# Patient Record
Sex: Female | Born: 2000 | Hispanic: No | Marital: Single | Smoking: Never smoker
Health system: Southern US, Community
[De-identification: ages and names within clinical notes are randomized; demographics above are authoritative.]

## PROBLEM LIST (undated history)

## (undated) HISTORY — PX: NO PAST SURGERIES: SHX2092

---

## 2021-04-10 ENCOUNTER — Emergency Department (HOSPITAL_COMMUNITY): Payer: PRIVATE HEALTH INSURANCE

## 2021-04-10 ENCOUNTER — Encounter (HOSPITAL_COMMUNITY): Payer: Self-pay | Admitting: General Surgery

## 2021-04-10 ENCOUNTER — Inpatient Hospital Stay (HOSPITAL_COMMUNITY)
Admission: EM | Admit: 2021-04-10 | Discharge: 2021-04-20 | DRG: 957 | Disposition: A | Discharge status: Rehabilitation Facility | Payer: PRIVATE HEALTH INSURANCE | Attending: General Surgery | Admitting: General Surgery

## 2021-04-10 DIAGNOSIS — S0181XA Laceration without foreign body of other part of head, initial encounter: Secondary | ICD-10-CM | POA: Diagnosis present

## 2021-04-10 DIAGNOSIS — S066XAA Traumatic subarachnoid hemorrhage with loss of consciousness status unknown, initial encounter: Secondary | ICD-10-CM | POA: Diagnosis present

## 2021-04-10 DIAGNOSIS — T148XXA Other injury of unspecified body region, initial encounter: Secondary | ICD-10-CM

## 2021-04-10 DIAGNOSIS — F02818 Dementia in other diseases classified elsewhere, unspecified severity, with other behavioral disturbance: Secondary | ICD-10-CM | POA: Diagnosis not present

## 2021-04-10 DIAGNOSIS — I6200 Nontraumatic subdural hemorrhage, unspecified: Secondary | ICD-10-CM

## 2021-04-10 DIAGNOSIS — S8001XA Contusion of right knee, initial encounter: Secondary | ICD-10-CM | POA: Diagnosis present

## 2021-04-10 DIAGNOSIS — S32049A Unspecified fracture of fourth lumbar vertebra, initial encounter for closed fracture: Secondary | ICD-10-CM | POA: Diagnosis present

## 2021-04-10 DIAGNOSIS — D62 Acute posthemorrhagic anemia: Secondary | ICD-10-CM | POA: Diagnosis present

## 2021-04-10 DIAGNOSIS — S062X4A Diffuse traumatic brain injury with loss of consciousness of 6 hours to 24 hours, initial encounter: Secondary | ICD-10-CM | POA: Diagnosis present

## 2021-04-10 DIAGNOSIS — Z419 Encounter for procedure for purposes other than remedying health state, unspecified: Secondary | ICD-10-CM

## 2021-04-10 DIAGNOSIS — R402312 Coma scale, best motor response, none, at arrival to emergency department: Secondary | ICD-10-CM | POA: Diagnosis present

## 2021-04-10 DIAGNOSIS — Y92410 Unspecified street and highway as the place of occurrence of the external cause: Secondary | ICD-10-CM

## 2021-04-10 DIAGNOSIS — S270XXA Traumatic pneumothorax, initial encounter: Secondary | ICD-10-CM | POA: Diagnosis present

## 2021-04-10 DIAGNOSIS — S062X1S Diffuse traumatic brain injury with loss of consciousness of 30 minutes or less, sequela: Secondary | ICD-10-CM | POA: Diagnosis not present

## 2021-04-10 DIAGNOSIS — R402122 Coma scale, eyes open, to pain, at arrival to emergency department: Secondary | ICD-10-CM | POA: Diagnosis present

## 2021-04-10 DIAGNOSIS — S32009A Unspecified fracture of unspecified lumbar vertebra, initial encounter for closed fracture: Secondary | ICD-10-CM | POA: Diagnosis present

## 2021-04-10 DIAGNOSIS — Z23 Encounter for immunization: Secondary | ICD-10-CM

## 2021-04-10 DIAGNOSIS — S065XAA Traumatic subdural hemorrhage with loss of consciousness status unknown, initial encounter: Secondary | ICD-10-CM | POA: Diagnosis present

## 2021-04-10 DIAGNOSIS — M4014 Other secondary kyphosis, thoracic region: Secondary | ICD-10-CM | POA: Diagnosis present

## 2021-04-10 DIAGNOSIS — E440 Moderate protein-calorie malnutrition: Secondary | ICD-10-CM | POA: Diagnosis not present

## 2021-04-10 DIAGNOSIS — S42031S Displaced fracture of lateral end of right clavicle, sequela: Secondary | ICD-10-CM | POA: Diagnosis not present

## 2021-04-10 DIAGNOSIS — S069X0S Unspecified intracranial injury without loss of consciousness, sequela: Secondary | ICD-10-CM | POA: Diagnosis not present

## 2021-04-10 DIAGNOSIS — S22000A Wedge compression fracture of unspecified thoracic vertebra, initial encounter for closed fracture: Secondary | ICD-10-CM | POA: Diagnosis present

## 2021-04-10 DIAGNOSIS — R0603 Acute respiratory distress: Secondary | ICD-10-CM

## 2021-04-10 DIAGNOSIS — S42031A Displaced fracture of lateral end of right clavicle, initial encounter for closed fracture: Secondary | ICD-10-CM | POA: Diagnosis present

## 2021-04-10 DIAGNOSIS — I609 Nontraumatic subarachnoid hemorrhage, unspecified: Secondary | ICD-10-CM

## 2021-04-10 DIAGNOSIS — R402212 Coma scale, best verbal response, none, at arrival to emergency department: Secondary | ICD-10-CM | POA: Diagnosis present

## 2021-04-10 DIAGNOSIS — S82202A Unspecified fracture of shaft of left tibia, initial encounter for closed fracture: Secondary | ICD-10-CM | POA: Diagnosis present

## 2021-04-10 DIAGNOSIS — S062XAD Diffuse traumatic brain injury with loss of consciousness status unknown, subsequent encounter: Secondary | ICD-10-CM | POA: Diagnosis not present

## 2021-04-10 DIAGNOSIS — S22089A Unspecified fracture of T11-T12 vertebra, initial encounter for closed fracture: Secondary | ICD-10-CM | POA: Diagnosis present

## 2021-04-10 DIAGNOSIS — G936 Cerebral edema: Secondary | ICD-10-CM | POA: Diagnosis present

## 2021-04-10 DIAGNOSIS — Z978 Presence of other specified devices: Secondary | ICD-10-CM

## 2021-04-10 DIAGNOSIS — Z20822 Contact with and (suspected) exposure to covid-19: Secondary | ICD-10-CM | POA: Diagnosis present

## 2021-04-10 DIAGNOSIS — S0990XA Unspecified injury of head, initial encounter: Secondary | ICD-10-CM | POA: Diagnosis present

## 2021-04-10 DIAGNOSIS — S70311A Abrasion, right thigh, initial encounter: Secondary | ICD-10-CM | POA: Diagnosis present

## 2021-04-10 DIAGNOSIS — S069XAA Unspecified intracranial injury with loss of consciousness status unknown, initial encounter: Secondary | ICD-10-CM

## 2021-04-10 DIAGNOSIS — S40012A Contusion of left shoulder, initial encounter: Secondary | ICD-10-CM | POA: Diagnosis present

## 2021-04-10 DIAGNOSIS — S27321A Contusion of lung, unilateral, initial encounter: Secondary | ICD-10-CM | POA: Diagnosis present

## 2021-04-10 DIAGNOSIS — S065XAD Traumatic subdural hemorrhage with loss of consciousness status unknown, subsequent encounter: Secondary | ICD-10-CM | POA: Diagnosis not present

## 2021-04-10 DIAGNOSIS — M25572 Pain in left ankle and joints of left foot: Secondary | ICD-10-CM

## 2021-04-10 DIAGNOSIS — J9601 Acute respiratory failure with hypoxia: Secondary | ICD-10-CM | POA: Diagnosis present

## 2021-04-10 DIAGNOSIS — R339 Retention of urine, unspecified: Secondary | ICD-10-CM | POA: Diagnosis not present

## 2021-04-10 DIAGNOSIS — G479 Sleep disorder, unspecified: Secondary | ICD-10-CM | POA: Diagnosis not present

## 2021-04-10 DIAGNOSIS — S82202S Unspecified fracture of shaft of left tibia, sequela: Secondary | ICD-10-CM | POA: Diagnosis not present

## 2021-04-10 DIAGNOSIS — S069X2S Unspecified intracranial injury with loss of consciousness of 31 minutes to 59 minutes, sequela: Secondary | ICD-10-CM | POA: Diagnosis not present

## 2021-04-10 DIAGNOSIS — S22080S Wedge compression fracture of T11-T12 vertebra, sequela: Secondary | ICD-10-CM | POA: Diagnosis not present

## 2021-04-10 DIAGNOSIS — J939 Pneumothorax, unspecified: Secondary | ICD-10-CM

## 2021-04-10 DIAGNOSIS — S069XAS Unspecified intracranial injury with loss of consciousness status unknown, sequela: Secondary | ICD-10-CM | POA: Diagnosis not present

## 2021-04-10 DIAGNOSIS — S069X3S Unspecified intracranial injury with loss of consciousness of 1 hour to 5 hours 59 minutes, sequela: Secondary | ICD-10-CM | POA: Diagnosis not present

## 2021-04-10 DIAGNOSIS — S42001A Fracture of unspecified part of right clavicle, initial encounter for closed fracture: Secondary | ICD-10-CM | POA: Diagnosis present

## 2021-04-10 DIAGNOSIS — R402431 Glasgow coma scale score 3-8, in the field [EMT or ambulance]: Secondary | ICD-10-CM

## 2021-04-10 HISTORY — DX: Unspecified intracranial injury with loss of consciousness status unknown, initial encounter: S06.9XAA

## 2021-04-10 LAB — CBC
HCT: 44 % (ref 36.0–46.0)
Hemoglobin: 13.7 g/dL (ref 12.0–15.0)
MCH: 27.3 pg (ref 26.0–34.0)
MCHC: 31.1 g/dL (ref 30.0–36.0)
MCV: 87.6 fL (ref 80.0–100.0)
Platelets: 324 10*3/uL (ref 150–400)
RBC: 5.02 MIL/uL (ref 3.87–5.11)
RDW: 13.3 % (ref 11.5–15.5)
WBC: 25.2 10*3/uL — ABNORMAL HIGH (ref 4.0–10.5)
nRBC: 0 % (ref 0.0–0.2)

## 2021-04-10 LAB — COMPREHENSIVE METABOLIC PANEL
ALT: 41 U/L (ref 0–44)
AST: 78 U/L — ABNORMAL HIGH (ref 15–41)
Albumin: 3.7 g/dL (ref 3.5–5.0)
Alkaline Phosphatase: 64 U/L (ref 38–126)
Anion gap: 10 (ref 5–15)
BUN: 12 mg/dL (ref 6–20)
CO2: 23 mmol/L (ref 22–32)
Calcium: 8.7 mg/dL — ABNORMAL LOW (ref 8.9–10.3)
Chloride: 104 mmol/L (ref 98–111)
Creatinine, Ser: 0.75 mg/dL (ref 0.44–1.00)
GFR, Estimated: >60 mL/min (ref >60)
Glucose, Bld: 171 mg/dL — ABNORMAL HIGH (ref 70–99)
Potassium: 4.1 mmol/L (ref 3.5–5.1)
Sodium: 137 mmol/L (ref 135–145)
Total Bilirubin: 0.8 mg/dL (ref 0.3–1.2)
Total Protein: 6.6 g/dL (ref 6.5–8.1)

## 2021-04-10 LAB — SAMPLE TO BLOOD BANK

## 2021-04-10 LAB — I-STAT ARTERIAL BLOOD GAS, ED
Acid-Base Excess: 2 mmol/L (ref 0.0–2.0)
Bicarbonate: 25.3 mmol/L (ref 20.0–28.0)
Calcium, Ion: 1.17 mmol/L (ref 1.15–1.40)
HCT: 36 % (ref 36.0–46.0)
Hemoglobin: 12.2 g/dL (ref 12.0–15.0)
O2 Saturation: 100 %
Patient temperature: 97.7
Potassium: 3.9 mmol/L (ref 3.5–5.1)
Sodium: 137 mmol/L (ref 135–145)
TCO2: 26 mmol/L (ref 22–32)
pCO2 arterial: 33.4 mmHg (ref 32.0–48.0)
pH, Arterial: 7.485 — ABNORMAL HIGH (ref 7.350–7.450)
pO2, Arterial: 567 mmHg — ABNORMAL HIGH (ref 83.0–108.0)

## 2021-04-10 LAB — I-STAT BETA HCG BLOOD, ED (MC, WL, AP ONLY): I-stat hCG, quantitative: <5 m[IU]/mL (ref <5)

## 2021-04-10 LAB — I-STAT CHEM 8, ED
BUN: 13 mg/dL (ref 6–20)
Calcium, Ion: 1.1 mmol/L — ABNORMAL LOW (ref 1.15–1.40)
Chloride: 105 mmol/L (ref 98–111)
Creatinine, Ser: 0.7 mg/dL (ref 0.44–1.00)
Glucose, Bld: 165 mg/dL — ABNORMAL HIGH (ref 70–99)
HCT: 42 % (ref 36.0–46.0)
Hemoglobin: 14.3 g/dL (ref 12.0–15.0)
Potassium: 3.9 mmol/L (ref 3.5–5.1)
Sodium: 138 mmol/L (ref 135–145)
TCO2: 27 mmol/L (ref 22–32)

## 2021-04-10 LAB — RESP PANEL BY RT-PCR (FLU A&B, COVID) ARPGX2
Influenza A by PCR: NEGATIVE
Influenza B by PCR: NEGATIVE
SARS Coronavirus 2 by RT PCR: NEGATIVE

## 2021-04-10 LAB — ETHANOL: Alcohol, Ethyl (B): <10 mg/dL (ref <10)

## 2021-04-10 LAB — PROTIME-INR
INR: 1.2 (ref 0.8–1.2)
Prothrombin Time: 15.5 seconds — ABNORMAL HIGH (ref 11.4–15.2)

## 2021-04-10 LAB — LACTIC ACID, PLASMA: Lactic Acid, Venous: 2 mmol/L (ref 0.5–1.9)

## 2021-04-10 LAB — MRSA NEXT GEN BY PCR, NASAL: MRSA by PCR Next Gen: NOT DETECTED

## 2021-04-10 MED ORDER — FENTANYL CITRATE PF 50 MCG/ML IJ SOSY
50.0000 ug | PREFILLED_SYRINGE | Freq: Once | INTRAMUSCULAR | Status: AC
  Administered 2021-04-10: 50 ug via INTRAVENOUS

## 2021-04-10 MED ORDER — METOPROLOL TARTRATE 5 MG/5ML IV SOLN: 5.0000 mg | Freq: Four times a day (QID) | INTRAVENOUS | Status: DC | PRN

## 2021-04-10 MED ORDER — ORAL CARE MOUTH RINSE
15.0000 mL | OROMUCOSAL | Status: DC
  Administered 2021-04-10 – 2021-04-12 (×11): 15 mL via OROMUCOSAL

## 2021-04-10 MED ORDER — TETANUS-DIPHTH-ACELL PERTUSSIS 5-2.5-18.5 LF-MCG/0.5 IM SUSY
0.5000 mL | PREFILLED_SYRINGE | Freq: Once | INTRAMUSCULAR | Status: AC
  Administered 2021-04-10: 0.5 mL via INTRAMUSCULAR
  Filled 2021-04-10: qty 0.5

## 2021-04-10 MED ORDER — PANTOPRAZOLE SODIUM 40 MG IV SOLR
40.0000 mg | Freq: Every day | INTRAVENOUS | Status: DC
  Administered 2021-04-10 – 2021-04-11 (×2): 40 mg via INTRAVENOUS
  Filled 2021-04-10 (×2): qty 40

## 2021-04-10 MED ORDER — CHLORHEXIDINE GLUCONATE 0.12% ORAL RINSE (MEDLINE KIT)
15.0000 mL | Freq: Two times a day (BID) | OROMUCOSAL | Status: DC
  Administered 2021-04-10 – 2021-04-11 (×2): 15 mL via OROMUCOSAL

## 2021-04-10 MED ORDER — POLYETHYLENE GLYCOL 3350 17 G PO PACK
17.0000 g | PACK | Freq: Every day | ORAL | Status: DC
  Administered 2021-04-10 – 2021-04-12 (×2): 17 g
  Filled 2021-04-10 (×2): qty 1

## 2021-04-10 MED ORDER — PANTOPRAZOLE SODIUM 40 MG PO TBEC
40.0000 mg | DELAYED_RELEASE_TABLET | Freq: Every day | ORAL | Status: DC
  Filled 2021-04-10: qty 1

## 2021-04-10 MED ORDER — ONDANSETRON 4 MG PO TBDP: 4.0000 mg | ORAL_TABLET | Freq: Four times a day (QID) | ORAL | Status: DC | PRN

## 2021-04-10 MED ORDER — IOHEXOL 350 MG/ML SOLN
100.0000 mL | Freq: Once | INTRAVENOUS | Status: AC | PRN
  Administered 2021-04-10: 100 mL via INTRAVENOUS

## 2021-04-10 MED ORDER — FENTANYL CITRATE PF 50 MCG/ML IJ SOSY: 50.0000 ug | PREFILLED_SYRINGE | Freq: Once | INTRAMUSCULAR | Status: DC

## 2021-04-10 MED ORDER — ONDANSETRON HCL 4 MG/2ML IJ SOLN
4.0000 mg | Freq: Four times a day (QID) | INTRAMUSCULAR | Status: DC | PRN
  Administered 2021-04-12: 4 mg via INTRAVENOUS
  Filled 2021-04-10: qty 2

## 2021-04-10 MED ORDER — PROPOFOL 1000 MG/100ML IV EMUL
0.0000 ug/kg/min | INTRAVENOUS | Status: DC
  Administered 2021-04-10: 30 ug/kg/min via INTRAVENOUS
  Administered 2021-04-10: 15 ug/kg/min via INTRAVENOUS
  Filled 2021-04-10: qty 100

## 2021-04-10 MED ORDER — SUCCINYLCHOLINE CHLORIDE 20 MG/ML IJ SOLN
INTRAMUSCULAR | Status: AC | PRN
  Administered 2021-04-10: 100 mg via INTRAVENOUS

## 2021-04-10 MED ORDER — FENTANYL 2500MCG IN NS 250ML (10MCG/ML) PREMIX INFUSION
50.0000 ug/h | INTRAVENOUS | Status: DC
Start: 2021-04-10 — End: 2021-04-10
  Administered 2021-04-10: 50 ug/h via INTRAVENOUS

## 2021-04-10 MED ORDER — SODIUM CHLORIDE 0.9 % IV SOLN
INTRAVENOUS | Status: AC | PRN
  Administered 2021-04-10: 1000 mL via INTRAVENOUS

## 2021-04-10 MED ORDER — ETOMIDATE 2 MG/ML IV SOLN
INTRAVENOUS | Status: AC | PRN
  Administered 2021-04-10: 20 mg via INTRAVENOUS

## 2021-04-10 MED ORDER — PROPOFOL 1000 MG/100ML IV EMUL
INTRAVENOUS | Status: AC
  Administered 2021-04-10: 15 ug/kg/min via INTRAVENOUS
  Filled 2021-04-10: qty 100

## 2021-04-10 MED ORDER — PROPOFOL 1000 MG/100ML IV EMUL: 0.0000 ug/kg/min | INTRAVENOUS | Status: DC

## 2021-04-10 MED ORDER — POTASSIUM CHLORIDE IN NACL 20-0.9 MEQ/L-% IV SOLN
INTRAVENOUS | Status: DC
  Filled 2021-04-10 (×17): qty 1000

## 2021-04-10 MED ORDER — FENTANYL BOLUS VIA INFUSION
50.0000 ug | INTRAVENOUS | Status: DC | PRN
  Filled 2021-04-10: qty 100

## 2021-04-10 MED ORDER — DOCUSATE SODIUM 50 MG/5ML PO LIQD
100.0000 mg | Freq: Two times a day (BID) | ORAL | Status: DC
  Administered 2021-04-10 (×2): 100 mg
  Filled 2021-04-10 (×3): qty 10

## 2021-04-10 MED ORDER — FENTANYL 2500MCG IN NS 250ML (10MCG/ML) PREMIX INFUSION
50.0000 ug/h | INTRAVENOUS | Status: DC
  Administered 2021-04-10: 75 ug/h via INTRAVENOUS
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
## 2021-04-10 NOTE — ED Notes (Signed)
Patient transported to CT 1

## 2021-04-10 NOTE — Progress Notes (Signed)
Patient transported to 4N27 from ED without complications. RN at bedside. 
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
## 2021-04-10 NOTE — Progress Notes (Signed)
Pt transported to CT and back to TRA A without any complications.  
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
## 2021-04-10 NOTE — Consult Note (Signed)
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 tenderness and deformity present.  Skin:    General: Skin is warm.     Capillary Refill: Capillary refill takes less than 2 seconds.     Coloration: Skin is not jaundiced.     Findings: Bruising present.  Neurological:     Comments: Sedated    Assessment/Plan: Complex laceration to forehead.  Repaired in ED.  Recommend Vaseline to area twice daily.  Head of bed elevated as able.  Wash hair when stable.  Follow up in 10 days as able.  Sherry Montes Sherry Montes 04/11/2021, 11:27 AM
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
## 2021-04-10 NOTE — Procedures (Signed)
Procedure Note  Level I trauma  Preoperative Dx: Complex laceration to forehead  Postoperative Dx: Same  Procedure: Layered repair of complex laceration to forehead 10 cm with excision of nonviable tissue 1 cm  Anesthesia: Lidocaine 1% with 1:100,000 epinephrine  Indication for Procedure: Level I trauma  Description of Procedure: Risks and complications were explained to the patient however she was sedated and intubated.  Time out was called and all information was confirmed to be correct.    The 10 cm area of forehead was prepped and drapped.  Lidocaine 1% with epinepherine was injected in the subcutaneous area.  There was no glass noted in the wound. After waiting several minutes for the local to take affect a #15 blade was used to excise the 1 cm area of nonviable tissue at the midpoint of the laceration.  A combination of 5-0 Monocryl and 4-0 Monocryl was used to close the 10 cm wound in a layered fashion.  Care was taken to line up the hair line.  A combination of interrupted and running suture was utilized.   The patient was intubated and sedated.  Recommend vaseline to the area twice daily and head of bed elevated as able when cleared with Neuro.

## 2021-04-11 ENCOUNTER — Inpatient Hospital Stay (HOSPITAL_COMMUNITY): Payer: PRIVATE HEALTH INSURANCE

## 2021-04-11 LAB — POCT I-STAT 7, (LYTES, BLD GAS, ICA,H+H)
Acid-base deficit: 4 mmol/L — ABNORMAL HIGH (ref 0.0–2.0)
Bicarbonate: 20 mmol/L (ref 20.0–28.0)
Calcium, Ion: 1.23 mmol/L (ref 1.15–1.40)
HCT: 32 % — ABNORMAL LOW (ref 36.0–46.0)
Hemoglobin: 10.9 g/dL — ABNORMAL LOW (ref 12.0–15.0)
O2 Saturation: 100 %
Patient temperature: 98
Potassium: 3.8 mmol/L (ref 3.5–5.1)
Sodium: 138 mmol/L (ref 135–145)
TCO2: 21 mmol/L — ABNORMAL LOW (ref 22–32)
pCO2 arterial: 31 mmHg — ABNORMAL LOW (ref 32.0–48.0)
pH, Arterial: 7.416 (ref 7.350–7.450)
pO2, Arterial: 162 mmHg — ABNORMAL HIGH (ref 83.0–108.0)

## 2021-04-11 LAB — BASIC METABOLIC PANEL
Anion gap: 8 (ref 5–15)
BUN: 12 mg/dL (ref 6–20)
CO2: 21 mmol/L — ABNORMAL LOW (ref 22–32)
Calcium: 8.3 mg/dL — ABNORMAL LOW (ref 8.9–10.3)
Chloride: 107 mmol/L (ref 98–111)
Creatinine, Ser: 0.68 mg/dL (ref 0.44–1.00)
GFR, Estimated: >60 mL/min (ref >60)
Glucose, Bld: 127 mg/dL — ABNORMAL HIGH (ref 70–99)
Potassium: 4 mmol/L (ref 3.5–5.1)
Sodium: 136 mmol/L (ref 135–145)

## 2021-04-11 LAB — CBC
HCT: 32.3 % — ABNORMAL LOW (ref 36.0–46.0)
Hemoglobin: 10.8 g/dL — ABNORMAL LOW (ref 12.0–15.0)
MCH: 27.6 pg (ref 26.0–34.0)
MCHC: 33.4 g/dL (ref 30.0–36.0)
MCV: 82.6 fL (ref 80.0–100.0)
Platelets: 192 10*3/uL (ref 150–400)
RBC: 3.91 MIL/uL (ref 3.87–5.11)
RDW: 13.6 % (ref 11.5–15.5)
WBC: 10.6 10*3/uL — ABNORMAL HIGH (ref 4.0–10.5)
nRBC: 0 % (ref 0.0–0.2)

## 2021-04-11 LAB — TRIGLYCERIDES: Triglycerides: 49 mg/dL (ref <150)

## 2021-04-11 LAB — HIV ANTIBODY (ROUTINE TESTING W REFLEX): HIV Screen 4th Generation wRfx: NONREACTIVE

## 2021-04-11 MED ORDER — METHOCARBAMOL 500 MG PO TABS
1000.0000 mg | ORAL_TABLET | Freq: Three times a day (TID) | ORAL | Status: DC
  Administered 2021-04-11 – 2021-04-20 (×13): 1000 mg via ORAL
  Filled 2021-04-11 (×17): qty 2

## 2021-04-11 MED ORDER — MORPHINE SULFATE (PF) 2 MG/ML IV SOLN
2.0000 mg | INTRAVENOUS | Status: DC | PRN
Start: 2021-04-11 — End: 2021-04-20
  Administered 2021-04-11 – 2021-04-16 (×2): 2 mg via INTRAVENOUS
  Administered 2021-04-17: 4 mg via INTRAVENOUS
  Administered 2021-04-17: 2 mg via INTRAVENOUS
  Filled 2021-04-11 (×3): qty 1
  Filled 2021-04-11: qty 2

## 2021-04-11 MED ORDER — WHITE PETROLATUM EX OINT
TOPICAL_OINTMENT | CUTANEOUS | Status: AC
  Filled 2021-04-11: qty 28.35

## 2021-04-11 MED ORDER — ENOXAPARIN SODIUM 30 MG/0.3ML IJ SOSY
30.0000 mg | PREFILLED_SYRINGE | Freq: Two times a day (BID) | INTRAMUSCULAR | Status: DC
  Administered 2021-04-12 – 2021-04-20 (×10): 30 mg via SUBCUTANEOUS
  Filled 2021-04-11 (×13): qty 0.3

## 2021-04-11 MED ORDER — ACETAMINOPHEN 500 MG PO TABS
1000.0000 mg | ORAL_TABLET | Freq: Four times a day (QID) | ORAL | Status: DC
  Administered 2021-04-11 – 2021-04-19 (×15): 1000 mg via ORAL
  Filled 2021-04-11 (×24): qty 2

## 2021-04-11 MED ORDER — OXYCODONE HCL 5 MG/5ML PO SOLN: 5.0000 mg | ORAL | Status: DC | PRN

## 2021-04-11 MED ORDER — CHLORHEXIDINE GLUCONATE CLOTH 2 % EX PADS
6.0000 | MEDICATED_PAD | Freq: Every day | CUTANEOUS | Status: DC
  Administered 2021-04-11 – 2021-04-17 (×7): 6 via TOPICAL

## 2021-04-11 MED ORDER — KETOROLAC TROMETHAMINE 15 MG/ML IJ SOLN
30.0000 mg | Freq: Four times a day (QID) | INTRAMUSCULAR | Status: DC
  Administered 2021-04-11 – 2021-04-12 (×3): 30 mg via INTRAVENOUS
  Filled 2021-04-11 (×4): qty 2

## 2021-04-11 NOTE — Progress Notes (Signed)
PT Cancellation Note  Patient Details Name: Sherry Montes MRN: [id number] DOB: [birth year]   Cancelled Treatment:    Reason Eval/Treat Not Completed: Medical issues which prohibited therapy - awaiting imaging per neurosurgery, PT to check back when medically appropriate.  [name], PT DPT Acute Rehabilitation Services Pager [phone]  Office [phone]    [name] 04/11/2021, 3:03 PM
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
## 2021-04-11 NOTE — Progress Notes (Signed)
Family at bedside stated that patient can't feel anything in her bilateral feet. RN assessed and pt did not withdrawal to pain.

## 2021-04-11 NOTE — Progress Notes (Addendum)
7225 RN called to room, pt self extubated. Dr. Bedelia Person and RRT at bedside. Dr. Bedelia Person removed c-spine collar. Pt on non-rebreather, O2 100%. WCTM  0800 Pt attempting to pull out IV, take bandage off of forehead, and remove NRB. Mittens unsuccessful, soft limb restraints applied.   0815 Pt c/o abdominal pain, pt states she can't urinate. Pt bladder scanned noted. Spoke to Dr. Bedelia Person, order to insert foley catheter, 850 of clear, amber urine emptied.

## 2021-04-11 NOTE — Procedures (Signed)
Extubation Procedure Note  Patient Details:   Name: Sherry Montes DOB: 10-Jan-2001 MRN: [id number]   Airway Documentation:    Vent end date: 04/11/21 Vent end time: 0752   Evaluation  RT responded to vent alarm and found pt had self extubated. MD and RN were called. Pt was placed on 100% NRB mask. No new orders at this time. Guss Bunde 04/11/2021, 7:54 AM
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
## 2021-04-12 LAB — CBC
HCT: 28 % — ABNORMAL LOW (ref 36.0–46.0)
Hemoglobin: 8.9 g/dL — ABNORMAL LOW (ref 12.0–15.0)
MCH: 27.2 pg (ref 26.0–34.0)
MCHC: 31.8 g/dL (ref 30.0–36.0)
MCV: 85.6 fL (ref 80.0–100.0)
Platelets: 115 10*3/uL — ABNORMAL LOW (ref 150–400)
RBC: 3.27 MIL/uL — ABNORMAL LOW (ref 3.87–5.11)
RDW: 13.7 % (ref 11.5–15.5)
WBC: 7 10*3/uL (ref 4.0–10.5)
nRBC: 0 % (ref 0.0–0.2)

## 2021-04-12 LAB — BASIC METABOLIC PANEL
Anion gap: 7 (ref 5–15)
BUN: 6 mg/dL (ref 6–20)
CO2: 18 mmol/L — ABNORMAL LOW (ref 22–32)
Calcium: 8 mg/dL — ABNORMAL LOW (ref 8.9–10.3)
Chloride: 113 mmol/L — ABNORMAL HIGH (ref 98–111)
Creatinine, Ser: 0.62 mg/dL (ref 0.44–1.00)
GFR, Estimated: >60 mL/min (ref >60)
Glucose, Bld: 108 mg/dL — ABNORMAL HIGH (ref 70–99)
Potassium: 4 mmol/L (ref 3.5–5.1)
Sodium: 138 mmol/L (ref 135–145)

## 2021-04-12 LAB — TRIGLYCERIDES: Triglycerides: 65 mg/dL (ref <150)

## 2021-04-12 MED ORDER — POLYETHYLENE GLYCOL 3350 17 G PO PACK
17.0000 g | PACK | Freq: Every day | ORAL | Status: DC
  Administered 2021-04-13 – 2021-04-20 (×5): 17 g via ORAL
  Filled 2021-04-12 (×6): qty 1

## 2021-04-12 MED ORDER — DOCUSATE SODIUM 100 MG PO CAPS
100.0000 mg | ORAL_CAPSULE | Freq: Two times a day (BID) | ORAL | Status: DC
  Administered 2021-04-13 – 2021-04-20 (×7): 100 mg via ORAL
  Filled 2021-04-12 (×10): qty 1

## 2021-04-12 MED ORDER — OXYCODONE HCL 5 MG PO TABS
5.0000 mg | ORAL_TABLET | ORAL | Status: DC | PRN
Start: 2021-04-12 — End: 2021-04-20
  Administered 2021-04-13: 5 mg via ORAL
  Administered 2021-04-18 – 2021-04-19 (×3): 10 mg via ORAL
  Filled 2021-04-12 (×5): qty 2

## 2021-04-12 MED ORDER — CHLORHEXIDINE GLUCONATE 0.12 % MT SOLN
15.0000 mL | Freq: Two times a day (BID) | OROMUCOSAL | Status: DC
  Administered 2021-04-12 – 2021-04-17 (×8): 15 mL via OROMUCOSAL
  Filled 2021-04-12 (×11): qty 15

## 2021-04-12 MED ORDER — ORAL CARE MOUTH RINSE
15.0000 mL | Freq: Two times a day (BID) | OROMUCOSAL | Status: DC
  Administered 2021-04-13 – 2021-04-15 (×2): 15 mL via OROMUCOSAL
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
## 2021-04-12 NOTE — TOC CAGE-AID Note (Signed)
Transition of Care [facility]) - CAGE-AID Screening   Patient Details  Name: Marshawn Ninneman MRN: [id number] Date of Birth: 07/18/2000  Transition of [facility]) CM/SW Contact:    Luane Rochon C Tarpley-Carter, LCSWA Phone Number: 04/12/2021, 12:17 PM   Clinical Narrative: Pt is unable to participate in Cage Aid.  Nannie Starzyk Tarpley-Carter, MSW, LCSW-A Pronouns:  She/Her/Hers [facility] Clinical Social Worker Direct Number:  [phone] [email or web address]  CAGE-AID Screening: Substance Abuse Screening unable to be completed due to: : Patient unable to participate             Substance Abuse Education Offered: No
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
## 2021-04-12 NOTE — Progress Notes (Signed)
Dr. Austin Miles is a family friend and his number is in the chart [phone]). Family and him are asking for Dr. Maurice Small or neurosurgery on call to please call and update with recent MRI results in the morning (12/9).
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
## 2021-04-12 NOTE — Progress Notes (Signed)
? ?  Inpatient Rehab Admissions Coordinator : ? ?Per therapy recommendations, patient was screened for CIR candidacy by Marcelle Bebout RN MSN.  At this time patient appears to be a potential candidate for CIR. I will place a rehab consult per protocol for full assessment. Please call me with any questions. ? ?Arraya Buck RN MSN ?Admissions Coordinator ?[phone] ?  ?
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
## 2021-04-12 NOTE — Progress Notes (Signed)
Patient had one episode of vomiting. Given PRN zofran and washed out mouth with medline. Patient states she feels much better. Dr Janee Morn notified.  Sherral Hammers, RN
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
## 2021-04-13 LAB — BASIC METABOLIC PANEL
Anion gap: 8 (ref 5–15)
BUN: 11 mg/dL (ref 6–20)
CO2: 19 mmol/L — ABNORMAL LOW (ref 22–32)
Calcium: 8 mg/dL — ABNORMAL LOW (ref 8.9–10.3)
Chloride: 111 mmol/L (ref 98–111)
Creatinine, Ser: 0.54 mg/dL (ref 0.44–1.00)
GFR, Estimated: >60 mL/min (ref >60)
Glucose, Bld: 92 mg/dL (ref 70–99)
Potassium: 4.4 mmol/L (ref 3.5–5.1)
Sodium: 138 mmol/L (ref 135–145)

## 2021-04-13 LAB — CBC
HCT: 30.3 % — ABNORMAL LOW (ref 36.0–46.0)
Hemoglobin: 9.9 g/dL — ABNORMAL LOW (ref 12.0–15.0)
MCH: 27.7 pg (ref 26.0–34.0)
MCHC: 32.7 g/dL (ref 30.0–36.0)
MCV: 84.9 fL (ref 80.0–100.0)
Platelets: 120 10*3/uL — ABNORMAL LOW (ref 150–400)
RBC: 3.57 MIL/uL — ABNORMAL LOW (ref 3.87–5.11)
RDW: 13.4 % (ref 11.5–15.5)
WBC: 6.9 10*3/uL (ref 4.0–10.5)
nRBC: 0 % (ref 0.0–0.2)
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
## 2021-04-13 NOTE — Progress Notes (Signed)
Inpatient Rehab Admissions Coordinator:   I spoke with pt.'s aunt and uncle (Pt. Sleeping) regarding potential CIR admit. They state interest and would like to pursue CIR.  They confirm ability to provide 24/7 support. They are looking for information on Pt.'s insurance, as they think she may have had a policy on file. They will reach out to me if they find any information on a policy. I will continue to follow for potential admit pending insurance auth (if she has insurance), medical readiness,  and bed availability.   Megan Salon, MS, CCC-SLP Rehab Admissions Coordinator  [phone] (celll) [phone] (office)
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
## 2021-04-13 NOTE — Progress Notes (Signed)
Orthopedic Tech Progress Note Patient Details:  Sherry Montes 2001-03-25 [id number]  Ortho Devices Type of Ortho Device: Arm sling Ortho Device/Splint Location: RUE Ortho Device/Splint Interventions: Ordered, Application   Post Interventions Patient Tolerated: Well  Iveth Heidemann A Ingris Pasquarella 04/13/2021, 1:28 PM
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
## 2021-04-14 LAB — BASIC METABOLIC PANEL
Anion gap: 6 (ref 5–15)
BUN: 7 mg/dL (ref 6–20)
CO2: 20 mmol/L — ABNORMAL LOW (ref 22–32)
Calcium: 8 mg/dL — ABNORMAL LOW (ref 8.9–10.3)
Chloride: 109 mmol/L (ref 98–111)
Creatinine, Ser: 0.54 mg/dL (ref 0.44–1.00)
GFR, Estimated: >60 mL/min (ref >60)
Glucose, Bld: 98 mg/dL (ref 70–99)
Potassium: 4.2 mmol/L (ref 3.5–5.1)
Sodium: 135 mmol/L (ref 135–145)

## 2021-04-14 LAB — CBC
HCT: 28.1 % — ABNORMAL LOW (ref 36.0–46.0)
Hemoglobin: 9.1 g/dL — ABNORMAL LOW (ref 12.0–15.0)
MCH: 27.3 pg (ref 26.0–34.0)
MCHC: 32.4 g/dL (ref 30.0–36.0)
MCV: 84.4 fL (ref 80.0–100.0)
Platelets: 158 10*3/uL (ref 150–400)
RBC: 3.33 MIL/uL — ABNORMAL LOW (ref 3.87–5.11)
RDW: 13.5 % (ref 11.5–15.5)
WBC: 5.3 10*3/uL (ref 4.0–10.5)
nRBC: 0 % (ref 0.0–0.2)

## 2021-04-14 MED ORDER — BETHANECHOL CHLORIDE 25 MG PO TABS
25.0000 mg | ORAL_TABLET | Freq: Three times a day (TID) | ORAL | Status: DC
  Administered 2021-04-14 – 2021-04-20 (×4): 25 mg via ORAL
  Filled 2021-04-14 (×10): qty 1

## 2021-04-14 NOTE — Progress Notes (Signed)
Patient c/o difficulty urinating with little output during shift, bladder scan showed 800 mL retained, MD paged, order to place Q6 hr in and out catheterization PRN x 3.   At time of placing note, patient self urinated 500 mL and felt much better.
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
## 2021-04-14 NOTE — Plan of Care (Signed)
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
## 2021-04-16 ENCOUNTER — Inpatient Hospital Stay (HOSPITAL_COMMUNITY): Payer: PRIVATE HEALTH INSURANCE

## 2021-04-16 LAB — BASIC METABOLIC PANEL
Anion gap: 9 (ref 5–15)
BUN: 6 mg/dL (ref 6–20)
CO2: 21 mmol/L — ABNORMAL LOW (ref 22–32)
Calcium: 8.6 mg/dL — ABNORMAL LOW (ref 8.9–10.3)
Chloride: 104 mmol/L (ref 98–111)
Creatinine, Ser: 0.6 mg/dL (ref 0.44–1.00)
GFR, Estimated: >60 mL/min (ref >60)
Glucose, Bld: 96 mg/dL (ref 70–99)
Potassium: 4.2 mmol/L (ref 3.5–5.1)
Sodium: 134 mmol/L — ABNORMAL LOW (ref 135–145)
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
## 2021-04-16 NOTE — Progress Notes (Signed)
Pt family requesting for pt to only have soft foods because she has trouble swallowing and her throat is sore, changed diet to mechanical soft, will f/u about another possible SLP consult. Pt able to eat a few bites of dinner tonight but had to spit out larger, harder food.

## 2021-04-16 NOTE — Progress Notes (Signed)
Made multiple attempts to give pt scheduled medications last night and this morning. I was told in report she started refusing during the day yesterday. Have educated the pt on the medications and she is still refusing at this time.
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
## 2021-04-16 NOTE — Progress Notes (Signed)
Pt refusing meds.  Attempted to crush Tylenol and give in chocolate pudding without success. Pt also not eating well.  Will continue attempts to give meds and encourage pt to eat.  Dr. Janee Morn and Johnny Bridge, NP are aware of above.

## 2021-04-16 NOTE — Progress Notes (Signed)
Patient ID: Sherry Montes, female   DOB: July 07, 2000, 20 y.o.   MRN: [id number]   LOS: 6 days   Subjective: Pt c/o left lower leg pain when prompted. Will answer questions but can't really give any details at this point in her TBI recovery.   Objective: Vital signs in last 24 hours: Temp:  [98 F (36.7 C)-99 F (37.2 C)] 98.5 F (36.9 C) (12/12 1532) Pulse Rate:  [73-118] 118 (12/12 1532) Resp:  [16-20] 20 (12/12 0827) BP: (108-117)/(69-78) 114/75 (12/12 1143) SpO2:  [99 %-100 %] 99 % (12/12 0357) Last BM Date:  (PTA)   Laboratory  CBC Recent Labs    04/14/21 0301  WBC 5.3  HGB 9.1*  HCT 28.1*  PLT 158   BMET Recent Labs    04/14/21 0301 04/16/21 0426  NA 135 134*  K 4.2 4.2  CL 109 104  CO2 20* 21*  GLUCOSE 98 96  BUN 7 6  CREATININE 0.54 0.60  CALCIUM 8.0* 8.6*     Physical Exam General appearance: alert and no distress LLE: Mod TTP, extensive ecchymoses lower leg   Assessment/Plan: Left tibia fx -- Plan IMN with Dr. Carola Frost tomorrow. Please keep NPO after MN.    Freeman Caldron, PA-C Orthopedic Surgery [phone] 04/16/2021

## 2021-04-16 NOTE — Progress Notes (Addendum)
Inpatient Rehab Admissions Coordinator:    I do not have a bed for this Pt. On CIR day. Pt. States she has [employer], we were able to identify a policy number. I will send to billing and open a case for insurance auth once Pt. Is seen by PT/OT today.;   Megan Salon, MS, CCC-SLP Rehab Admissions Coordinator  [phone] (celll) [phone] (office)
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
## 2021-04-16 NOTE — Progress Notes (Signed)
Orthopedic Tech Progress Note Patient Details:  Sherry Montes March 08, 2001 [id number]  Ortho Devices Type of Ortho Device: Short leg splint, Cotton web roll Ortho Device/Splint Location: LLE Ortho Device/Splint Interventions: Ordered, Application   Post Interventions Patient Tolerated: Well Instructions Provided: Care of device  [name] 04/16/2021, 5:13 PM

## 2021-04-17 ENCOUNTER — Inpatient Hospital Stay (HOSPITAL_COMMUNITY): Payer: PRIVATE HEALTH INSURANCE | Admitting: Certified Registered"

## 2021-04-17 ENCOUNTER — Inpatient Hospital Stay (HOSPITAL_COMMUNITY): Payer: PRIVATE HEALTH INSURANCE

## 2021-04-17 ENCOUNTER — Encounter (HOSPITAL_COMMUNITY): Payer: Self-pay | Admitting: General Surgery

## 2021-04-17 ENCOUNTER — Encounter (HOSPITAL_COMMUNITY): Admission: EM | Disposition: A | Discharge status: Rehabilitation Facility | Payer: Self-pay | Source: Home / Self Care

## 2021-04-17 HISTORY — PX: TIBIA IM NAIL INSERTION: SHX2516

## 2021-04-17 SURGERY — INSERTION, INTRAMEDULLARY ROD, TIBIA
Anesthesia: General | Site: Leg Lower | Laterality: Left

## 2021-04-17 MED ORDER — ROCURONIUM BROMIDE 10 MG/ML (PF) SYRINGE
PREFILLED_SYRINGE | INTRAVENOUS | Status: DC | PRN
  Administered 2021-04-17: 60 mg via INTRAVENOUS
  Administered 2021-04-17: 50 mg via INTRAVENOUS
  Administered 2021-04-17: 30 mg via INTRAVENOUS

## 2021-04-17 MED ORDER — CHLORHEXIDINE GLUCONATE 4 % EX LIQD: 60.0000 mL | Freq: Once | CUTANEOUS | Status: DC

## 2021-04-17 MED ORDER — ONDANSETRON HCL 4 MG/2ML IJ SOLN
INTRAMUSCULAR | Status: DC | PRN
  Administered 2021-04-17: 4 mg via INTRAVENOUS

## 2021-04-17 MED ORDER — 0.9 % SODIUM CHLORIDE (POUR BTL) OPTIME
TOPICAL | Status: DC | PRN
  Administered 2021-04-17: 1000 mL

## 2021-04-17 MED ORDER — LACTATED RINGERS IV SOLN: INTRAVENOUS | Status: DC

## 2021-04-17 MED ORDER — FENTANYL CITRATE (PF) 100 MCG/2ML IJ SOLN
25.0000 ug | INTRAMUSCULAR | Status: DC | PRN
  Administered 2021-04-17 (×2): 50 ug via INTRAVENOUS

## 2021-04-17 MED ORDER — KETOROLAC TROMETHAMINE 30 MG/ML IJ SOLN
INTRAMUSCULAR | Status: AC
  Filled 2021-04-17: qty 1

## 2021-04-17 MED ORDER — CEFAZOLIN SODIUM-DEXTROSE 2-4 GM/100ML-% IV SOLN
INTRAVENOUS | Status: AC
  Filled 2021-04-17: qty 100

## 2021-04-17 MED ORDER — FENTANYL CITRATE (PF) 250 MCG/5ML IJ SOLN
INTRAMUSCULAR | Status: AC
  Filled 2021-04-17: qty 5

## 2021-04-17 MED ORDER — PROPOFOL 10 MG/ML IV BOLUS
INTRAVENOUS | Status: DC | PRN
  Administered 2021-04-17: 120 mg via INTRAVENOUS

## 2021-04-17 MED ORDER — ACETAMINOPHEN 10 MG/ML IV SOLN
INTRAVENOUS | Status: DC | PRN
  Administered 2021-04-17: 1000 mg via INTRAVENOUS

## 2021-04-17 MED ORDER — ROCURONIUM BROMIDE 10 MG/ML (PF) SYRINGE
PREFILLED_SYRINGE | INTRAVENOUS | Status: AC
  Filled 2021-04-17: qty 10

## 2021-04-17 MED ORDER — PROPOFOL 10 MG/ML IV BOLUS
INTRAVENOUS | Status: AC
  Filled 2021-04-17: qty 20

## 2021-04-17 MED ORDER — ACETAMINOPHEN 10 MG/ML IV SOLN
INTRAVENOUS | Status: AC
  Filled 2021-04-17: qty 100

## 2021-04-17 MED ORDER — POVIDONE-IODINE 10 % EX SWAB: 2.0000 | Freq: Once | CUTANEOUS | Status: DC

## 2021-04-17 MED ORDER — FENTANYL CITRATE (PF) 100 MCG/2ML IJ SOLN
INTRAMUSCULAR | Status: AC
  Filled 2021-04-17: qty 2

## 2021-04-17 MED ORDER — KETOROLAC TROMETHAMINE 30 MG/ML IJ SOLN
INTRAMUSCULAR | Status: DC | PRN
  Administered 2021-04-17: 15 mg via INTRAVENOUS

## 2021-04-17 MED ORDER — SUGAMMADEX SODIUM 200 MG/2ML IV SOLN
INTRAVENOUS | Status: DC | PRN
  Administered 2021-04-17: 200 mg via INTRAVENOUS

## 2021-04-17 MED ORDER — ONDANSETRON HCL 4 MG/2ML IJ SOLN
INTRAMUSCULAR | Status: AC
  Filled 2021-04-17: qty 2

## 2021-04-17 MED ORDER — ACETAMINOPHEN 500 MG PO TABS: 1000.0000 mg | ORAL_TABLET | Freq: Once | ORAL | Status: DC | PRN

## 2021-04-17 MED ORDER — ACETAMINOPHEN 10 MG/ML IV SOLN: 1000.0000 mg | Freq: Once | INTRAVENOUS | Status: DC | PRN

## 2021-04-17 MED ORDER — OXYCODONE HCL 5 MG/5ML PO SOLN: 5.0000 mg | Freq: Once | ORAL | Status: DC | PRN

## 2021-04-17 MED ORDER — PHENYLEPHRINE 40 MCG/ML (10ML) SYRINGE FOR IV PUSH (FOR BLOOD PRESSURE SUPPORT)
PREFILLED_SYRINGE | INTRAVENOUS | Status: DC | PRN
  Administered 2021-04-17: 40 ug via INTRAVENOUS
  Administered 2021-04-17 (×3): 80 ug via INTRAVENOUS

## 2021-04-17 MED ORDER — FENTANYL CITRATE (PF) 100 MCG/2ML IJ SOLN
INTRAMUSCULAR | Status: DC | PRN
  Administered 2021-04-17 (×5): 50 ug via INTRAVENOUS

## 2021-04-17 MED ORDER — PHENYLEPHRINE 40 MCG/ML (10ML) SYRINGE FOR IV PUSH (FOR BLOOD PRESSURE SUPPORT)
PREFILLED_SYRINGE | INTRAVENOUS | Status: AC
  Filled 2021-04-17: qty 10

## 2021-04-17 MED ORDER — OXYCODONE HCL 5 MG PO TABS: 5.0000 mg | ORAL_TABLET | Freq: Once | ORAL | Status: DC | PRN

## 2021-04-17 MED ORDER — CEFAZOLIN SODIUM-DEXTROSE 2-4 GM/100ML-% IV SOLN
2.0000 g | INTRAVENOUS | Status: AC
  Administered 2021-04-17: 2 g via INTRAVENOUS

## 2021-04-17 MED ORDER — DEXAMETHASONE SODIUM PHOSPHATE 10 MG/ML IJ SOLN
INTRAMUSCULAR | Status: AC
  Filled 2021-04-17: qty 1

## 2021-04-17 MED ORDER — CEFAZOLIN SODIUM-DEXTROSE 2-4 GM/100ML-% IV SOLN
2.0000 g | Freq: Three times a day (TID) | INTRAVENOUS | Status: AC
  Administered 2021-04-17 – 2021-04-18 (×2): 2 g via INTRAVENOUS
  Filled 2021-04-17 (×2): qty 100

## 2021-04-17 MED ORDER — LIDOCAINE 2% (20 MG/ML) 5 ML SYRINGE
INTRAMUSCULAR | Status: DC | PRN
  Administered 2021-04-17: 100 mg via INTRAVENOUS

## 2021-04-17 MED ORDER — ACETAMINOPHEN 160 MG/5ML PO SOLN: 1000.0000 mg | Freq: Once | ORAL | Status: DC | PRN

## 2021-04-17 MED ORDER — DEXAMETHASONE SODIUM PHOSPHATE 10 MG/ML IJ SOLN
INTRAMUSCULAR | Status: DC | PRN
  Administered 2021-04-17: 8 mg via INTRAVENOUS

## 2021-04-17 MED ORDER — MIDAZOLAM HCL 2 MG/2ML IJ SOLN
INTRAMUSCULAR | Status: AC
  Filled 2021-04-17: qty 2

## 2021-04-17 SURGICAL SUPPLY — 48 items
BAG COUNTER SPONGE SURGICOUNT (BAG) ×2 IMPLANT
BAG SPNG CNTER NS LX DISP (BAG) ×1
BIT DRILL CALIBRATED 4.2 (BIT) IMPLANT
BIT DRILL SHORT 3.2MM (DRILL) IMPLANT
BLADE SURG 10 STRL SS (BLADE) ×2 IMPLANT
BNDG ELASTIC 4X5.8 VLCR STR LF (GAUZE/BANDAGES/DRESSINGS) ×2 IMPLANT
BRUSH SCRUB EZ PLAIN DRY (MISCELLANEOUS) ×4 IMPLANT
COVER SURGICAL LIGHT HANDLE (MISCELLANEOUS) ×3 IMPLANT
DRAPE C-ARM 42X72 X-RAY (DRAPES) ×2 IMPLANT
DRAPE C-ARMOR (DRAPES) ×2 IMPLANT
DRAPE HALF SHEET 40X57 (DRAPES) ×1 IMPLANT
DRAPE U-SHAPE 47X51 STRL (DRAPES) ×2 IMPLANT
DRESSING MEPILEX FLEX 4X4 (GAUZE/BANDAGES/DRESSINGS) IMPLANT
DRILL BIT CALIBRATED 4.2 (BIT) ×2
DRILL SHORT 3.2MM (DRILL) ×2
DRSG MEPILEX BORDER 4X8 (GAUZE/BANDAGES/DRESSINGS) ×1 IMPLANT
DRSG MEPILEX FLEX 4X4 (GAUZE/BANDAGES/DRESSINGS) ×2
ELECT REM PT RETURN 9FT ADLT (ELECTROSURGICAL) ×2
ELECTRODE REM PT RTRN 9FT ADLT (ELECTROSURGICAL) ×1 IMPLANT
GAUZE SPONGE 4X4 12PLY STRL (GAUZE/BANDAGES/DRESSINGS) ×2 IMPLANT
GLOVE SRG 8 PF TXTR STRL LF DI (GLOVE) ×1 IMPLANT
GLOVE SURG ENC MOIS LTX SZ8 (GLOVE) ×2 IMPLANT
GLOVE SURG ENC MOIS LTX SZ8.5 (GLOVE) ×2 IMPLANT
GLOVE SURG ORTHO LTX SZ7.5 (GLOVE) ×4 IMPLANT
GLOVE SURG UNDER POLY LF SZ7.5 (GLOVE) ×2 IMPLANT
GLOVE SURG UNDER POLY LF SZ8 (GLOVE) ×2
GOWN STRL REUS W/ TWL LRG LVL3 (GOWN DISPOSABLE) ×2 IMPLANT
GOWN STRL REUS W/ TWL XL LVL3 (GOWN DISPOSABLE) ×1 IMPLANT
GOWN STRL REUS W/TWL LRG LVL3 (GOWN DISPOSABLE) ×4
GOWN STRL REUS W/TWL XL LVL3 (GOWN DISPOSABLE) ×2
KIT BASIN OR (CUSTOM PROCEDURE TRAY) ×2 IMPLANT
KIT TURNOVER KIT B (KITS) ×2 IMPLANT
NAIL TIB TFNA 8X330 (Nail) ×1 IMPLANT
PACK ORTHO EXTREMITY (CUSTOM PROCEDURE TRAY) ×2 IMPLANT
PAD ARMBOARD 7.5X6 YLW CONV (MISCELLANEOUS) ×4 IMPLANT
REAMER ROD 3.8 BALL TIP 3X950 (ORTHOPEDIC DISPOSABLE SUPPLIES) ×1 IMPLANT
SCREW LOCK IM 5X48 (Screw) ×1 IMPLANT
SCREW LOCK IM 5X54 (Screw) ×1 IMPLANT
SCREW LOCK IM NAIL 4X26 (Screw) ×1 IMPLANT
SCREW LOCK IM NAIL 4X30 (Screw) ×1 IMPLANT
SCREW LOCK IM TI 5X40 STRL (Screw) ×1 IMPLANT
STAPLER VISISTAT 35W (STAPLE) ×2 IMPLANT
SUT ETHILON 2 0 FS 18 (SUTURE) ×4 IMPLANT
SUT ETHILON 3 0 PS 1 (SUTURE) ×2 IMPLANT
SUT VIC AB 2-0 CT1 27 (SUTURE) ×2
SUT VIC AB 2-0 CT1 TAPERPNT 27 (SUTURE) ×1 IMPLANT
TOWEL GREEN STERILE (TOWEL DISPOSABLE) ×4 IMPLANT
TOWEL GREEN STERILE FF (TOWEL DISPOSABLE) ×2 IMPLANT
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
## 2021-04-17 NOTE — Anesthesia Procedure Notes (Signed)
Procedure Name: Intubation Date/Time: 04/17/2021 11:45 AM Performed by: Barrington Ellison, CRNA Pre-anesthesia Checklist: Patient identified, Emergency Drugs available, Suction available and Patient being monitored Patient Re-evaluated:Patient Re-evaluated prior to induction Oxygen Delivery Method: Circle System Utilized Preoxygenation: Pre-oxygenation with 100% oxygen Induction Type: IV induction Ventilation: Mask ventilation without difficulty Laryngoscope Size: Mac and 3 Grade View: Grade I Tube type: Oral Tube size: 7.0 mm Number of attempts: 1 Airway Equipment and Method: Stylet and Oral airway Placement Confirmation: ETT inserted through vocal cords under direct vision, positive ETCO2 and breath sounds checked- equal and bilateral Secured at: 20 cm Tube secured with: Tape Dental Injury: Teeth and Oropharynx as per pre-operative assessment

## 2021-04-17 NOTE — Anesthesia Postprocedure Evaluation (Signed)
Anesthesia Post Note  Patient: Sherry Montes  Procedure(s) Performed: INTRAMEDULLARY (IM) NAIL TIBIAL (Left: Leg Lower)     Anesthesia Post Evaluation No notable events documented.  Last Vitals:  Vitals:   04/17/21 1407 04/17/21 1422  BP: 125/90 120/81  Pulse: 93 95  Resp: 13 13  Temp: 36.4 C   SpO2: 100% 100%    Last Pain:  Vitals:   04/17/21 1422  TempSrc:   PainSc: Asleep                 Ewart Carrera

## 2021-04-17 NOTE — Transfer of Care (Signed)
Immediate Anesthesia Transfer of Care Note  Patient: Sherry Montes  Procedure(s) Performed: INTRAMEDULLARY (IM) NAIL TIBIAL (Left: Leg Lower)  Patient Location: PACU  Anesthesia Type:General  Level of Consciousness: awake  Airway & Oxygen Therapy: Patient Spontanous Breathing and Patient connected to nasal cannula oxygen  Post-op Assessment: Report given to RN  Post vital signs: Reviewed and stable  Last Vitals:  Vitals Value Taken Time  BP 127/75 04/17/21 1337  Temp    Pulse 113 04/17/21 1337  Resp 23 04/17/21 1337  SpO2 91 % 04/17/21 1337  Vitals shown include unvalidated device data.  Last Pain:  Vitals:   04/17/21 1025  TempSrc: Oral  PainSc:       Patients Stated Pain Goal:  (unable to obtain) (04/17/21 1025)  Complications: No notable events documented.

## 2021-04-17 NOTE — Progress Notes (Signed)
Inpatient Rehab Admissions Coordinator:   Pt. In surgery today. I continue to follow for potential admission pending medical readiness and bed availability.  Megan Salon, MS, CCC-SLP Rehab Admissions Coordinator  [phone] (celll) [phone] (office)

## 2021-04-17 NOTE — Progress Notes (Signed)
PT Cancellation Note  Patient Details Name: Niralya Ohanian MRN: [id number] DOB: July 01, 2000   Cancelled Treatment:    Reason Eval/Treat Not Completed: Patient at procedure or test/unavailable - in OR, PT to check back as schedule allows.  [name], PT DPT Acute Rehabilitation Services Pager [phone]  Office [phone]    [name] 04/17/2021, 11:28 AM
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
## 2021-04-17 NOTE — Progress Notes (Signed)
I have agreed to assume care from Dr. Jena Gauss.   The risks and benefits of surgery for Breta's left tibia fracture, including the possibility of infection, nerve injury, vessel injury, wound breakdown, arthritis, symptomatic hardware, DVT/ PE, loss of motion, malunion, nonunion, and need for further surgery among others, were discussed with the patient's uncle.  He acknowledged these risks and provided consent to proceed.  Myrene Galas, MD [facility], [facility] [phone]

## 2021-04-17 NOTE — Anesthesia Preprocedure Evaluation (Addendum)
Anesthesia Evaluation  Patient identified by MRN, date of birth, ID band Patient confused    Reviewed: Allergy & Precautions, NPO status , Patient's Chart, lab work & pertinent test results  History of Anesthesia Complications Negative for: history of anesthetic complications  Airway Mallampati: III  TM Distance: >3 FB Neck ROM: Full    Dental  (+) Teeth Intact   Pulmonary neg pulmonary ROS,    breath sounds clear to auscultation       Cardiovascular negative cardio ROS   Rhythm:Regular     Neuro/Psych negative neurological ROS  negative psych ROS   GI/Hepatic Neg liver ROS,   Endo/Other    Renal/GU negative Renal ROS     Musculoskeletal  Left Tibial Fx   Abdominal   Peds  Hematology negative hematology ROS (+)   Anesthesia Other Findings   Reproductive/Obstetrics Lab Results      Component                Value               Date                      HCG                      <5.0                04/10/2021                                       Anesthesia Physical Anesthesia Plan  ASA: 3  Anesthesia Plan: General   Post-op Pain Management: Toradol IV (intra-op) and Ofirmev IV (intra-op)   Induction: Intravenous  PONV Risk Score and Plan: 3 and Ondansetron and Dexamethasone  Airway Management Planned: Oral ETT and LMA  Additional Equipment: None  Intra-op Plan:   Post-operative Plan: Extubation in OR  Informed Consent:     Dental advisory given  Plan Discussed with: CRNA, Anesthesiologist and Surgeon  Anesthesia Plan Comments:         Anesthesia Quick Evaluation

## 2021-04-18 LAB — BASIC METABOLIC PANEL
Anion gap: 8 (ref 5–15)
BUN: 8 mg/dL (ref 6–20)
CO2: 26 mmol/L (ref 22–32)
Calcium: 9.1 mg/dL (ref 8.9–10.3)
Chloride: 103 mmol/L (ref 98–111)
Creatinine, Ser: 0.58 mg/dL (ref 0.44–1.00)
GFR, Estimated: >60 mL/min (ref >60)
Glucose, Bld: 119 mg/dL — ABNORMAL HIGH (ref 70–99)
Potassium: 4.2 mmol/L (ref 3.5–5.1)
Sodium: 137 mmol/L (ref 135–145)

## 2021-04-18 LAB — CBC
HCT: 30.9 % — ABNORMAL LOW (ref 36.0–46.0)
Hemoglobin: 10.4 g/dL — ABNORMAL LOW (ref 12.0–15.0)
MCH: 27.8 pg (ref 26.0–34.0)
MCHC: 33.7 g/dL (ref 30.0–36.0)
MCV: 82.6 fL (ref 80.0–100.0)
Platelets: 321 10*3/uL (ref 150–400)
RBC: 3.74 MIL/uL — ABNORMAL LOW (ref 3.87–5.11)
RDW: 14.2 % (ref 11.5–15.5)
WBC: 9.3 10*3/uL (ref 4.0–10.5)
nRBC: 0 % (ref 0.0–0.2)

## 2021-04-18 NOTE — Progress Notes (Signed)
Inpatient Rehab Admissions Coordinator:   I do not have a bed for this Pt. On CIR today. Await therapy notes following surgery yesterday. I will continue to follow for potential admit pending medical readiness and bed availability.  Megan Salon, MS, CCC-SLP Rehab Admissions Coordinator  [phone] (celll) [phone] (office)
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
## 2021-04-20 ENCOUNTER — Encounter (HOSPITAL_COMMUNITY): Payer: Self-pay | Admitting: Physical Medicine & Rehabilitation

## 2021-04-20 ENCOUNTER — Other Ambulatory Visit: Payer: Self-pay

## 2021-04-20 ENCOUNTER — Inpatient Hospital Stay (HOSPITAL_COMMUNITY)
Admission: RE | Admit: 2021-04-20 | Discharge: 2021-05-09 | DRG: 945 | Disposition: A | Discharge status: Home / Self Care | Payer: PRIVATE HEALTH INSURANCE | Source: Intra-hospital | Attending: Physical Medicine & Rehabilitation | Admitting: Physical Medicine & Rehabilitation

## 2021-04-20 DIAGNOSIS — R4587 Impulsiveness: Secondary | ICD-10-CM | POA: Diagnosis not present

## 2021-04-20 DIAGNOSIS — S82252D Displaced comminuted fracture of shaft of left tibia, subsequent encounter for closed fracture with routine healing: Secondary | ICD-10-CM | POA: Diagnosis not present

## 2021-04-20 DIAGNOSIS — Z978 Presence of other specified devices: Secondary | ICD-10-CM

## 2021-04-20 DIAGNOSIS — S22080S Wedge compression fracture of T11-T12 vertebra, sequela: Secondary | ICD-10-CM | POA: Diagnosis not present

## 2021-04-20 DIAGNOSIS — G4700 Insomnia, unspecified: Secondary | ICD-10-CM | POA: Diagnosis not present

## 2021-04-20 DIAGNOSIS — S27321D Contusion of lung, unilateral, subsequent encounter: Secondary | ICD-10-CM

## 2021-04-20 DIAGNOSIS — S065XAD Traumatic subdural hemorrhage with loss of consciousness status unknown, subsequent encounter: Secondary | ICD-10-CM | POA: Diagnosis present

## 2021-04-20 DIAGNOSIS — S42001A Fracture of unspecified part of right clavicle, initial encounter for closed fracture: Secondary | ICD-10-CM | POA: Diagnosis present

## 2021-04-20 DIAGNOSIS — G479 Sleep disorder, unspecified: Secondary | ICD-10-CM | POA: Diagnosis not present

## 2021-04-20 DIAGNOSIS — S062X1S Diffuse traumatic brain injury with loss of consciousness of 30 minutes or less, sequela: Secondary | ICD-10-CM | POA: Diagnosis not present

## 2021-04-20 DIAGNOSIS — S42031D Displaced fracture of lateral end of right clavicle, subsequent encounter for fracture with routine healing: Secondary | ICD-10-CM

## 2021-04-20 DIAGNOSIS — S0181XD Laceration without foreign body of other part of head, subsequent encounter: Secondary | ICD-10-CM

## 2021-04-20 DIAGNOSIS — E440 Moderate protein-calorie malnutrition: Secondary | ICD-10-CM | POA: Diagnosis not present

## 2021-04-20 DIAGNOSIS — S069X0S Unspecified intracranial injury without loss of consciousness, sequela: Secondary | ICD-10-CM

## 2021-04-20 DIAGNOSIS — K5900 Constipation, unspecified: Secondary | ICD-10-CM | POA: Diagnosis not present

## 2021-04-20 DIAGNOSIS — S82202S Unspecified fracture of shaft of left tibia, sequela: Secondary | ICD-10-CM | POA: Diagnosis not present

## 2021-04-20 DIAGNOSIS — S32049D Unspecified fracture of fourth lumbar vertebra, subsequent encounter for fracture with routine healing: Secondary | ICD-10-CM | POA: Diagnosis not present

## 2021-04-20 DIAGNOSIS — S22000A Wedge compression fracture of unspecified thoracic vertebra, initial encounter for closed fracture: Secondary | ICD-10-CM | POA: Diagnosis present

## 2021-04-20 DIAGNOSIS — S42031S Displaced fracture of lateral end of right clavicle, sequela: Secondary | ICD-10-CM | POA: Diagnosis not present

## 2021-04-20 DIAGNOSIS — R339 Retention of urine, unspecified: Secondary | ICD-10-CM | POA: Diagnosis present

## 2021-04-20 DIAGNOSIS — S069XAS Unspecified intracranial injury with loss of consciousness status unknown, sequela: Secondary | ICD-10-CM | POA: Diagnosis not present

## 2021-04-20 DIAGNOSIS — S069X3S Unspecified intracranial injury with loss of consciousness of 1 hour to 5 hours 59 minutes, sequela: Secondary | ICD-10-CM

## 2021-04-20 DIAGNOSIS — S069XAA Unspecified intracranial injury with loss of consciousness status unknown, initial encounter: Secondary | ICD-10-CM | POA: Diagnosis not present

## 2021-04-20 DIAGNOSIS — S062XAD Diffuse traumatic brain injury with loss of consciousness status unknown, subsequent encounter: Principal | ICD-10-CM

## 2021-04-20 DIAGNOSIS — T148XXA Other injury of unspecified body region, initial encounter: Secondary | ICD-10-CM

## 2021-04-20 DIAGNOSIS — Z09 Encounter for follow-up examination after completed treatment for conditions other than malignant neoplasm: Secondary | ICD-10-CM

## 2021-04-20 DIAGNOSIS — Z9114 Patient's other noncompliance with medication regimen: Secondary | ICD-10-CM | POA: Diagnosis not present

## 2021-04-20 DIAGNOSIS — D62 Acute posthemorrhagic anemia: Secondary | ICD-10-CM

## 2021-04-20 DIAGNOSIS — S82202A Unspecified fracture of shaft of left tibia, initial encounter for closed fracture: Secondary | ICD-10-CM | POA: Diagnosis present

## 2021-04-20 DIAGNOSIS — S069X2S Unspecified intracranial injury with loss of consciousness of 31 minutes to 59 minutes, sequela: Secondary | ICD-10-CM | POA: Diagnosis not present

## 2021-04-20 DIAGNOSIS — S32009A Unspecified fracture of unspecified lumbar vertebra, initial encounter for closed fracture: Secondary | ICD-10-CM | POA: Diagnosis present

## 2021-04-20 DIAGNOSIS — S270XXD Traumatic pneumothorax, subsequent encounter: Secondary | ICD-10-CM

## 2021-04-20 DIAGNOSIS — S0181XA Laceration without foreign body of other part of head, initial encounter: Secondary | ICD-10-CM | POA: Diagnosis present

## 2021-04-20 LAB — CBC
HCT: 32.5 % — ABNORMAL LOW (ref 36.0–46.0)
Hemoglobin: 10.5 g/dL — ABNORMAL LOW (ref 12.0–15.0)
MCH: 27.3 pg (ref 26.0–34.0)
MCHC: 32.3 g/dL (ref 30.0–36.0)
MCV: 84.6 fL (ref 80.0–100.0)
Platelets: 446 10*3/uL — ABNORMAL HIGH (ref 150–400)
RBC: 3.84 MIL/uL — ABNORMAL LOW (ref 3.87–5.11)
RDW: 14.9 % (ref 11.5–15.5)
WBC: 7.6 10*3/uL (ref 4.0–10.5)
nRBC: 0 % (ref 0.0–0.2)

## 2021-04-20 LAB — CREATININE, SERUM
Creatinine, Ser: 0.61 mg/dL (ref 0.44–1.00)
GFR, Estimated: >60 mL/min (ref >60)

## 2021-04-20 MED ORDER — TRAZODONE HCL 50 MG PO TABS
50.0000 mg | ORAL_TABLET | Freq: Every evening | ORAL | Status: DC | PRN
  Administered 2021-04-20 – 2021-05-02 (×8): 50 mg via ORAL
  Filled 2021-04-20 (×13): qty 1

## 2021-04-20 MED ORDER — ONDANSETRON HCL 4 MG/2ML IJ SOLN: 4.0000 mg | Freq: Four times a day (QID) | INTRAMUSCULAR | Status: DC | PRN

## 2021-04-20 MED ORDER — ACETAMINOPHEN 325 MG PO TABS: 325.0000 mg | ORAL_TABLET | ORAL | Status: DC | PRN

## 2021-04-20 MED ORDER — METHOCARBAMOL 500 MG PO TABS
1000.0000 mg | ORAL_TABLET | Freq: Three times a day (TID) | ORAL | Status: DC
  Administered 2021-04-20 – 2021-04-21 (×2): 1000 mg via ORAL
  Filled 2021-04-20 (×11): qty 2

## 2021-04-20 MED ORDER — ENOXAPARIN SODIUM 30 MG/0.3ML IJ SOSY
30.0000 mg | PREFILLED_SYRINGE | Freq: Two times a day (BID) | INTRAMUSCULAR | Status: DC
  Administered 2021-04-20 – 2021-04-22 (×4): 30 mg via SUBCUTANEOUS
  Filled 2021-04-20 (×8): qty 0.3

## 2021-04-20 MED ORDER — OXYCODONE HCL 5 MG PO TABS
5.0000 mg | ORAL_TABLET | ORAL | Status: DC | PRN
Start: 2021-04-20 — End: 2021-05-07
  Administered 2021-04-20 – 2021-04-28 (×4): 5 mg via ORAL
  Filled 2021-04-20 (×2): qty 1
  Filled 2021-04-20: qty 2
  Filled 2021-04-20 (×3): qty 1
  Filled 2021-04-20: qty 2
  Filled 2021-04-20: qty 1
  Filled 2021-04-20: qty 2

## 2021-04-20 MED ORDER — BETHANECHOL CHLORIDE 25 MG PO TABS
25.0000 mg | ORAL_TABLET | Freq: Three times a day (TID) | ORAL | Status: DC
  Administered 2021-04-20 – 2021-04-21 (×2): 25 mg via ORAL
  Filled 2021-04-20 (×7): qty 1

## 2021-04-20 MED ORDER — POLYETHYLENE GLYCOL 3350 17 G PO PACK
17.0000 g | PACK | Freq: Every day | ORAL | Status: DC
  Administered 2021-04-21: 17 g via ORAL
  Filled 2021-04-20 (×3): qty 1

## 2021-04-20 MED ORDER — ONDANSETRON 4 MG PO TBDP
4.0000 mg | ORAL_TABLET | Freq: Four times a day (QID) | ORAL | Status: DC | PRN
  Administered 2021-04-27 – 2021-04-30 (×3): 4 mg via ORAL
  Filled 2021-04-20 (×3): qty 1

## 2021-04-20 MED ORDER — DOCUSATE SODIUM 100 MG PO CAPS
100.0000 mg | ORAL_CAPSULE | Freq: Two times a day (BID) | ORAL | Status: DC
  Administered 2021-04-20 – 2021-05-09 (×27): 100 mg via ORAL
  Filled 2021-04-20 (×36): qty 1
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
## 2021-04-20 NOTE — Progress Notes (Signed)
Patient admitted to floor from 4N via bed with staff by side. Patient alert and verbally responsive with periods of inappropriate gestures made throughout shift. Patient denies pain at this time. Patient  oriented to call bell and safety plan. Patient refused to Pharmacist, hospital and any other nurses on unit perform skin assessment. Jesusita Oka, PA made aware by  charge nurse and agreed to  document skin assessment,  Patient continues to display inappropriate  behaviors with staff and request to be left alone with female staff. Patient provided support throughout shift and bed in lowest position and call bell within reach. Will continue to monitor.

## 2021-04-20 NOTE — Progress Notes (Signed)
Inpatient Rehab Admissions Coordinator:  There is a bed available for pt in CIR today. Carl Best, PA-C made aware and in agreement. Pt's family, NSG, and TOC also made aware.   Wolfgang Phoenix, MS, CCC-SLP Admissions Coordinator [phone]
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
## 2021-04-20 NOTE — TOC Transition Note (Signed)
Transition of Care [facility]) - CM/SW Discharge Note   Patient Details  Name: Sherry Montes MRN: [id number] Date of Birth: 05-11-2000  [facility] CM/SW Contact:  Glennon Mac, RN Phone Number: 04/20/2021, 11:39 AM   Clinical Narrative:    Pt medically stable for discharge to [facility] IP Rehab today, and insurance authorization received. Plan discharge to CIR today pending bed availability.    Final next level of care: IP Rehab Facility Barriers to Discharge: Barriers Resolved   Patient Goals and CMS Choice   CMS Medicare.gov Compare Post Acute Care list provided to:: Patient Represenative (must comment) (Family) Choice offered to / list presented to : Parent                        Discharge Plan and Services   Discharge Planning Services: CM Consult Post Acute Care Choice: IP Rehab                               Social Determinants of Health (SDOH) Interventions     Readmission Risk Interventions No flowsheet data found.  Quintella Baton, RN, BSN  Trauma/Neuro ICU Case Manager [phone]
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
## 2021-04-20 NOTE — Progress Notes (Signed)
Pt. Has refused scheduled meds throughout the night and this morning, despite education.
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
## 2021-04-20 NOTE — Progress Notes (Signed)
3 Days Post-Op  Subjective: Patient resting in bed, mother at bedside.  No complaints.  Objective: Vital signs in last 24 hours: Temp:  [97.6 F (36.4 C)-99.2 F (37.3 C)] 98 F (36.7 C) (12/16 1211) Pulse Rate:  [79-106] 91 (12/16 1211) Resp:  [16-18] 18 (12/16 1211) BP: (105-117)/(62-94) 105/94 (12/16 1211) SpO2:  [95 %-100 %] 100 % (12/16 1211) Last BM Date:  (UTA)  Intake/Output from previous day: 12/15 0701 - 12/16 0700 In: -  Out: 500 [Urine:500] Intake/Output this shift: Total I/O In: 1900.4 [P.O.:30; I.V.:1870.4] Out: -   General appearance: alert, cooperative, and no distress Head: Left forehead incision intact and healing well, Monocryl suture knots noted.  There is some scabbing superiorly and extending into the hairline.  No open wounds are noted.  No erythema or cellulitic changes noted.  No subcutaneous fluid collection noted with palpation.  No purulence or drainage is noted.  Lab Results:  CBC Latest Ref Rng & Units 04/18/2021 04/14/2021 04/13/2021  WBC 4.0 - 10.5 K/uL 9.3 5.3 6.9  Hemoglobin 12.0 - 15.0 g/dL 10.4(L) 9.1(L) 9.9(L)  Hematocrit 36.0 - 46.0 % 30.9(L) 28.1(L) 30.3(L)  Platelets 150 - 400 K/uL 321 158 120(L)    BMET Recent Labs    04/18/21 0253  NA 137  K 4.2  CL 103  CO2 26  GLUCOSE 119*  BUN 8  CREATININE 0.58  CALCIUM 9.1   PT/INR No results for input(s): LABPROT, INR in the last 72 hours. ABG No results for input(s): PHART, HCO3 in the last 72 hours.  Invalid input(s): PCO2, PO2  Studies/Results: No results found.  Anti-infectives: Anti-infectives (From admission, onward)    Start     Dose/Rate Route Frequency Ordered Stop   04/17/21 1500  ceFAZolin (ANCEF) IVPB 2g/100 mL premix        2 g 200 mL/hr over 30 Minutes Intravenous Every 8 hours 04/17/21 1447 04/18/21 0628   04/17/21 1015  ceFAZolin (ANCEF) IVPB 2g/100 mL premix        2 g 200 mL/hr over 30 Minutes Intravenous On call to O.R. 04/17/21 1014 04/17/21 1136    04/17/21 0957  ceFAZolin (ANCEF) 2-4 GM/100ML-% IVPB       Note to Pharmacy: Janene Harvey C: cabinet override      04/17/21 0957 04/17/21 1136       Assessment/Plan: Forehead laceration:  20 year old female 10 days post repair of complex laceration with Dr. Ulice Bold on 04/10/2021.  She has been hospitalized.  She was resting in bed today on evaluation, mother at bedside.  Monocryl suture knots were removed, patient tolerated this well.  We discussed scar management with sunscreen daily to prevent discoloration.  We also discussed use of scar creams.  Discussed with patient and mother that she can begin using scar cream in 1 week over the forehead laceration.  All of their questions were answered to their content.  There is no signs of infection on exam.  Recommend following up in clinic after discharge.     LOS: 10 days    Leslee Home, PA-C 04/20/2021
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
## 2021-04-21 ENCOUNTER — Encounter (HOSPITAL_COMMUNITY): Payer: PRIVATE HEALTH INSURANCE

## 2021-04-21 DIAGNOSIS — S069X0S Unspecified intracranial injury without loss of consciousness, sequela: Secondary | ICD-10-CM

## 2021-04-21 MED ORDER — BISACODYL 10 MG RE SUPP
10.0000 mg | Freq: Every day | RECTAL | Status: DC | PRN
  Administered 2021-04-21 – 2021-05-03 (×2): 10 mg via RECTAL
  Filled 2021-04-21 (×4): qty 1
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
## 2021-04-21 NOTE — Progress Notes (Signed)
Patient refused all HS medications tonight. This nurse made several attempts to administer medications unsuccessfully. PO fluids provided.   Patient is currently lying in bed awake watching tv. Bed is in low position, bed alarm activated, floor mats x 2 in place, Call light within reach, and telesitter in place.

## 2021-04-21 NOTE — Progress Notes (Signed)
°   04/21/21 1800  What Happened  Was fall witnessed? No  Was patient injured? No  Patient found on floor  Found by Staff-comment  Stated prior activity ambulating-unassisted  Follow Up  MD notified Kirsteins  Time MD notified 30  Family notified Yes - comment (Left message on voicemail to call)  Time family notified 1812  Additional tests No  Progress note created (see row info) Yes  Adult Fall Risk Assessment  Risk Factor Category (scoring not indicated) High fall risk per protocol (document High fall risk)  Age 20  Fall History: Fall within 6 months prior to admission 0  Elimination; Bowel and/or Urine Incontinence 2  Elimination; Bowel and/or Urine Urgency/Frequency 2  Medications: includes PCA/Opiates, Anti-convulsants, Anti-hypertensives, Diuretics, Hypnotics, Laxatives, Sedatives, and Psychotropics 3  Patient Care Equipment 1  Mobility-Assistance 2  Mobility-Gait 2  Mobility-Sensory Deficit 0  Altered awareness of immediate physical environment 1  Impulsiveness 2  Lack of understanding of one's physical/cognitive limitations 4  Total Score 19  Patient Fall Risk Level High fall risk  Adult Fall Risk Interventions  Required Bundle Interventions *See Row Information* High fall risk - low, moderate, and high requirements implemented  Additional Interventions Use of appropriate toileting equipment (bedpan, BSC, etc.);HeadStart bed sensor (education/return demonstration);HeadStart chair sensor (education/return demonstration)  Screening for Fall Injury Risk (To be completed on HIGH fall risk patients) - Assessing Need for Floor Mats  Risk For Fall Injury- Criteria for Floor Mats Confusion/dementia (+NuDESC, CIWA, TBI, etc.)  Will Implement Floor Mats Yes (Patient already has floor mats)  Pain Assessment  Pain Scale Faces  Pain Score 0  PAINAD (Pain Assessment in Advanced Dementia)  Breathing 0  Negative Vocalization 0  Facial Expression 0  Body Language 0  Consolability  0  PAINAD Score 0  Pain Assessment/FLACC  Pain Rating: FLACC  - Face 0  Neurological  Neuro (WDL) X  Level of Consciousness Alert  Cognition Memory impairment;Impulsive;Poor attention/concentration;Poor judgement;Poor safety awareness  Speech Clear  R Hand Grip Moderate  L Hand Grip Moderate   Musculoskeletal  Musculoskeletal (WDL) WDL  Assistive Device Wheelchair  Generalized Weakness Yes  Weight Bearing Restrictions No  RUE Weight Bearing NWB  LLE Weight Bearing WBAT  Musculoskeletal Details  RUE Weakness  RUE Ortho/Supportive Device Sling  LLE Full movement  Integumentary  Integumentary (WDL) WDL  Skin Color Appropriate for ethnicity  Skin Condition Dry  Skin Integrity Intact  Ecchymosis Location Hip  Ecchymosis Location Orientation Posterior  Skin Turgor Non-tenting

## 2021-04-21 NOTE — Progress Notes (Signed)
Patient is A & O to self only. Patient will open her eyes when her name is called or touched however patient will not answer any questions. Patient has no noted BM, writer spoke to Dr. Wynn Banker for bowel protocol. No S/S of pain, no S/S of distress. Family at bedside. Bed in low position, safety measures in place. Call light within reach.
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
## 2021-04-21 NOTE — Progress Notes (Signed)
Patient observed sitting on her buttocks on the right side of the bed. Patient assisted back in to bed by 3 staff members. Patient is A & O to self. When asked what happened patient did not answer just looked at staff. No S/S of injury, patients had BM in her brief. Patient did receive a suppository this afternoon for no BM charted in over 72 hours. Dr. Larna Daughters notified, LM for patients aunt to call hospital.

## 2021-04-21 NOTE — Plan of Care (Signed)
°  Problem: RH Balance Goal: LTG Patient will maintain dynamic sitting balance (PT) Description: LTG:  Patient will maintain dynamic sitting balance with assistance during mobility activities (PT) Flowsheets (Taken 04/21/2021 1411) LTG: Pt will maintain dynamic sitting balance during mobility activities with:: Independent Goal: LTG Patient will maintain dynamic standing balance (PT) Description: LTG:  Patient will maintain dynamic standing balance with assistance during mobility activities (PT) Flowsheets (Taken 04/21/2021 1411) LTG: Pt will maintain dynamic standing balance during mobility activities with:: Supervision/Verbal cueing   Problem: Sit to Stand Goal: LTG:  Patient will perform sit to stand with assistance level (PT) Description: LTG:  Patient will perform sit to stand with assistance level (PT) Flowsheets (Taken 04/21/2021 1411) LTG: PT will perform sit to stand in preparation for functional mobility with assistance level: Supervision/Verbal cueing   Problem: RH Bed Mobility Goal: LTG Patient will perform bed mobility with assist (PT) Description: LTG: Patient will perform bed mobility with assistance, with/without cues (PT). Flowsheets (Taken 04/21/2021 1411) LTG: Pt will perform bed mobility with assistance level of: Independent   Problem: RH Car Transfers Goal: LTG Patient will perform car transfers with assist (PT) Description: LTG: Patient will perform car transfers with assistance (PT). Flowsheets (Taken 04/21/2021 1411) LTG: Pt will perform car transfers with assist:: Supervision/Verbal cueing   Problem: RH Ambulation Goal: LTG Patient will ambulate in controlled environment (PT) Description: LTG: Patient will ambulate in a controlled environment, # of feet with assistance (PT). Flowsheets (Taken 04/21/2021 1411) LTG: Pt will ambulate in controlled environ  assist needed:: Supervision/Verbal cueing LTG: Ambulation distance in controlled environment: 150 Goal: LTG  Patient will ambulate in home environment (PT) Description: LTG: Patient will ambulate in home environment, # of feet with assistance (PT). Flowsheets (Taken 04/21/2021 1411) LTG: Pt will ambulate in home environ  assist needed:: Supervision/Verbal cueing LTG: Ambulation distance in home environment: 50   Problem: RH Stairs Goal: LTG Patient will ambulate up and down stairs w/assist (PT) Description: LTG: Patient will ambulate up and down # of stairs with assistance (PT) Flowsheets (Taken 04/21/2021 1411) LTG: Pt will ambulate up/down stairs assist needed:: Supervision/Verbal cueing LTG: Pt will  ambulate up and down number of stairs: 14 Note: Per home set up (unable to confirm railings at eval)

## 2021-04-22 ENCOUNTER — Inpatient Hospital Stay (HOSPITAL_COMMUNITY): Payer: PRIVATE HEALTH INSURANCE

## 2021-04-22 DIAGNOSIS — S069XAA Unspecified intracranial injury with loss of consciousness status unknown, initial encounter: Secondary | ICD-10-CM

## 2021-04-22 NOTE — Discharge Instructions (Addendum)
Inpatient Rehab Discharge Instructions  Sherry Montes Discharge date and time: 05/09/21   Activities/Precautions/ Functional Status: Activity: No weight on right arm--can use it for walker or crutches.  Diet: Regular Wound Care:  Routine skin checks   Functional status:  ___ No restrictions     ___ Walk up steps independently __X_ 24/7 supervision/assistance   ___ Walk up steps with assistance ___ Intermittent supervision/assistance  ___ Bathe/dress independently ___ Walk with walker     ___ Bathe/dress with assistance ___ Walk Independently    ___ Shower independently ___ Walk with assistance    ___ Shower with assistance _X__ No alcohol     ___ Return to work/school ________    COMMUNITY REFERRALS UPON DISCHARGE:    Outpatient: PT     OT    ST                 Agency:[facility]                        Address:[address], [place], [place] [address]                       Phone:[phone]             Appointment Date/Time:*Please expect follow-up within 7-10 business days to schedule your appointment. If you have not received follow-up, be sure to contact the site directly.*  Medical Equipment/Items Ordered:rolling walker and 3in1 bedside commode                                                 Agency/Supplier:[facility] [phone]   GENERAL COMMUNITY RESOURCES FOR PATIENT/FAMILY: Hospital follow-up appointment at [facility] with Dr. Toy Baker on February 13 at 2:30pm. Please be sure to discuss financial assistance at time of appointment.  [facility] [address] [place], [place] [address] Phone: [phone]   Special Instructions: No driving smoking or alcohol 2.  No weight on right arm.     My questions have been answered and I understand these instructions. I will adhere to these goals and the provided educational materials after my discharge from the hospital.  Patient/Caregiver  Signature _______________________________ Date __________  Clinician Signature _______________________________________ Date __________  Please bring this form and your medication list with you to all your follow-up doctor's appointments.

## 2021-04-22 NOTE — Progress Notes (Signed)
VASCULAR LAB    Bilateral lower extremity venous duplex has been performed.  See CV proc for preliminary results.   Jeiry Birnbaum, RVT 04/22/2021, 11:59 AM

## 2021-04-22 NOTE — Progress Notes (Signed)
Patient refused all AM medications, several attempts. Dr. Wynn Banker notified. No new orders

## 2021-04-22 NOTE — IPOC Note (Signed)
Overall Plan of Care [facility]) Patient Details Name: Sherry Montes MRN: [id number] DOB: 2000-12-10  Admitting Diagnosis: TBI (traumatic brain injury)  Hospital Problems: Principal Problem:   TBI (traumatic brain injury)     Functional Problem List: Nursing Behavior, Bladder, Bowel, Edema, Endurance, Medication Management, Motor, Nutrition, Pain, Safety, Sensory, Skin Integrity, Perception  PT Balance, Behavior, Endurance, Motor, Nutrition, Pain, Perception, Safety, Sensory, Skin Integrity  OT Balance, Behavior, Safety, Cognition, Endurance, Vision, Motor, Pain  SLP Behavior, Cognition, Safety, Perception  TR         Basic ADLs: OT Grooming, Bathing, Dressing, Toileting, Eating     Advanced  ADLs: OT       Transfers: PT Bed Mobility, Bed to Chair, Car, Occupational psychologist, Research scientist (life sciences): PT Ambulation, Psychologist, prison and probation services, Stairs     Additional Impairments: OT    SLP Social Cognition   Social Interaction, Problem Solving, Memory, Attention, Awareness  TR      Anticipated Outcomes Item Anticipated Outcome  Self Feeding supervision  Swallowing  N/A   Basic self-care  min assist-supervision  Toileting  min assist   Bathroom Transfers supervision  Bowel/Bladder  min assist  Transfers  spv  Locomotion  spv  Communication  N/A  Cognition  supervision basic cog/safety/awareness/problem solving  Pain  < 3  Safety/Judgment  min assist and no falls   Therapy Plan: PT Intensity: Minimum of 1-2 x/day ,45 to 90 minutes PT Frequency: 5 out of 7 days PT Duration Estimated Length of Stay: 12-14 days OT Intensity: Minimum of 1-2 x/day, 45 to 90 minutes OT Frequency: 5 out of 7 days OT Duration/Estimated Length of Stay: 10-12 days SLP Intensity: Minumum of 1-2 x/day, 30 to 90 minutes SLP Frequency: 3 to 5 out of 7 days SLP Duration/Estimated Length of Stay: 12-14 days   Due to the current state of emergency, patients may not be receiving  their 3-hours of Medicare-mandated therapy.   Team Interventions: Nursing Interventions Patient/Family Education, Bladder Management, Bowel Management, Disease Management/Prevention, Pain Management, Medication Management, Skin Care/Wound Management, Discharge Planning, Psychosocial Support, Dysphagia/Aspiration Precaution Training, Cognitive Remediation/Compensation  PT interventions Ambulation/gait training, Discharge planning, Functional mobility training, Psychosocial support, Therapeutic Activities, Visual/perceptual remediation/compensation, Balance/vestibular training, Disease management/prevention, Neuromuscular re-education, Skin care/wound management, Therapeutic Exercise, Wheelchair propulsion/positioning, Cognitive remediation/compensation, DME/adaptive equipment instruction, Pain management, Splinting/orthotics, UE/LE Strength taining/ROM, Community reintegration, Development worker, international aid stimulation, Museum/gallery curator, Equities trader education, UE/LE Coordination activities  OT Interventions Warden/ranger, Discharge planning, Pain management, Self Care/advanced ADL retraining, Therapeutic Activities, UE/LE Coordination activities, Cognitive remediation/compensation, Disease mangement/prevention, Functional mobility training, Patient/family education, Therapeutic Exercise, Visual/perceptual remediation/compensation, Firefighter, Fish farm manager, Neuromuscular re-education, Psychosocial support, UE/LE Strength taining/ROM, Wheelchair propulsion/positioning  SLP Interventions Cognitive remediation/compensation, Internal/external aids, Speech/Language facilitation, Environmental controls, Cueing hierarchy, Functional tasks, Patient/family education  TR Interventions    SW/CM Interventions     Barriers to Discharge MD  Medical stability  Nursing Decreased caregiver support, Home environment access/layout, Incontinence, Wound Care, Lack of/limited family  support, Weight bearing restrictions, Medication compliance, Behavior, Nutrition means Lives in multi-level home with 3 steps to enter and rails available. 3 steps to alternate levels and no rails. Aunt and uncle to provide care. Parents will provide care as well when they arrive from [place].  PT Decreased caregiver support, Inaccessible home environment, Home environment access/layout, Behavior    OT Decreased caregiver support Pt currently has only intermittent supervision available by her aunt.  Her parents are out of country and applying for a [address]  to come to states and provide needed assist to patient  SLP Decreased caregiver support patient lives with friend's family (she calls them Aunt and Uncle) her parents are trying to travel to [place] but are having difficulty with Estate agent  SW       Team Discharge Planning: Destination: PT-Home ,OT- Home , SLP-Home Projected Follow-up: PT-Outpatient PT, 24 hour supervision/assistance, OT-  24 hour supervision/assistance, Home health OT, SLP-Outpatient SLP Projected Equipment Needs: PT-To be determined, OT- To be determined, SLP-None recommended by SLP Equipment Details: PT- , OT-  Patient/family involved in discharge planning: PT- Patient, Patient unable/family or caregiver not available,  OT-Patient, Family member/caregiver, SLP-Family member/caregiver  MD ELOS: 12-14 days Medical Rehab Prognosis:  Excellent Assessment: The patient has been admitted for CIR therapies with the diagnosis of TBI, RLAS V. The team will be addressing functional mobility, strength, stamina, balance, safety, adaptive techniques and equipment, self-care, bowel and bladder mgt, patient and caregiver education, NMR, cognition, behavior, communication. Goals have been set at supervision for mobility, self-care and cognition/communication.   Due to the current state of emergency, patients may not be receiving their 3 hours per day of Medicare-mandated therapy.     Ranelle Oyster, MD, FAAPMR     See Team Conference Notes for weekly updates to the plan of care
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
## 2021-04-22 NOTE — Progress Notes (Signed)
This nurse attempted to give pts night meds, pt took the cup and threw medications to the floor, and threw the cup of water. Pt was educated on the importance of taking the meds. Pt continued to refuse.

## 2021-04-23 DIAGNOSIS — G479 Sleep disorder, unspecified: Secondary | ICD-10-CM

## 2021-04-23 DIAGNOSIS — S82202S Unspecified fracture of shaft of left tibia, sequela: Secondary | ICD-10-CM

## 2021-04-23 MED ORDER — METHYLPHENIDATE HCL 5 MG PO TABS
5.0000 mg | ORAL_TABLET | Freq: Two times a day (BID) | ORAL | Status: DC
  Administered 2021-04-23 – 2021-04-30 (×10): 5 mg via ORAL
  Filled 2021-04-23 (×13): qty 1

## 2021-04-23 NOTE — Care Management (Signed)
Inpatient Rehabilitation Center Individual Statement of Services  Patient Name:  Sherry Montes  Date:  04/23/2021  Welcome to the Inpatient Rehabilitation Center.  Our goal is to provide you with an individualized program based on your diagnosis and situation, designed to meet your specific needs.  With this comprehensive rehabilitation program, you will be expected to participate in at least 3 hours of rehabilitation therapies Monday-Friday, with modified therapy programming on the weekends.  Your rehabilitation program will include the following services:  Physical Therapy (PT), Occupational Therapy (OT), Speech Therapy (ST), 24 hour per day rehabilitation nursing, Therapeutic Recreaction (TR), Psychology, Neuropsychology, Care Coordinator, Rehabilitation Medicine, Nutrition Services, Pharmacy Services, and Other  Weekly team conferences will be held on Tuesdays to discuss your progress.  Your Inpatient Rehabilitation Care Coordinator will talk with you frequently to get your input and to update you on team discussions.  Team conferences with you and your family in attendance may also be held.  Expected length of stay: 10-14 days    Overall anticipated outcome: Supervision  Depending on your progress and recovery, your program may change. Your Inpatient Rehabilitation Care Coordinator will coordinate services and will keep you informed of any changes. Your Inpatient Rehabilitation Care Coordinator's name and contact numbers are listed  below.  The following services may also be recommended but are not provided by the Inpatient Rehabilitation Center:  Driving Evaluations Home Health Rehabiltiation Services Outpatient Rehabilitation Services Vocational Rehabilitation   Arrangements will be made to provide these services after discharge if needed.  Arrangements include referral to agencies that provide these services.  Your insurance has been verified to be:  Occupational hygienist primary doctor is:  Education officer, museum  Pertinent information will be shared with your doctor and your insurance company.  Inpatient Rehabilitation Care Coordinator:  Susie Cassette [phone] or [phone]  Information discussed with and copy given to patient by: Gretchen Short, 04/23/2021, 9:58 AM

## 2021-04-23 NOTE — Progress Notes (Signed)
Physical Therapy TBI Note  Patient Details  Name: Sherry Montes MRN: [id number] Date of Birth: 2000/06/27  Today's Date: 04/23/2021 PT Individual Time: 1005-1047 PT Individual Time Calculation (min): 42 min   Short Term Goals: Week 1:  PT Short Term Goal 1 (Week 1): Patient will participate in at least 75% of her scheduled therapy PT Short Term Goal 2 (Week 1): Patient will transfer bed <> wc with LRAD and ModA x1 PT Short Term Goal 3 (Week 1): Patient will ambulate >5 ft with LRAD and +2 assist as needed  Skilled Therapeutic Interventions/Progress Updates:     Patient in bed upon PT arrival. Patient alert throughout session. Exhibits behaviors resistant to participation such as covering her face with covers, closing her eyes, falsely stating need for the bathroom. Patient with intermittent sexually inappropriate behaviors with therapist and rehab tech throughout session. Able to redirect behaviors by providing another place to place hands and redirecting focus. Did provide education on behaviors being inappropriate x2 with poor carryover from patient.   Focused session on staff tolerance, mobility OOB, initiation, rapport building, out of room tolerance, and functional mobility. Required increased time and manual facilitation to initiate all mobility, responded well to 1-2-3 cues. Attempted to engage patient in gait training, patient verbally refused each trial and would return to sitting when PT attempted to facilitate initiation of stepping. Patient reported L ankle pain x1 and denied pain x2 during session.   Therapeutic Activity: Bed Mobility: Patient performed supine to/from long sitting x1 with min A and supine to/from sit with min-mod A +2 to initiate. Provided multi modal cues for initiation and sequencing. Patient performed scooting up in bed with max cues and supervision x2. Transfers: Patient performed sit to/from stand x5 and stand pivot x2 with mod A to initiation and in  A to complete stand with patient supported under B axilla and L hand on therapist's arm. Patient initiated holding on with R hand with difficulty redirecting patient to maintain compliance with precautions due to decreased attention and awareness.   Patient tolerated being out of the room >10 min, moderately demanding where to go while dependently pushed in w/c. PT utilized selection from 2 options and would only allow patient to choose 1. Attempted to have patient participate in conversation, gait training, and item identification without success. Patient maintained flat affect without escalation of behaviors throughout.   Patient asked to return to the bed due to fatigue. Patient in bed at end of session with breaks locked, bed alarm set, Telesitter in place, and all needs within reach. Patient missed 18 min of skilled PT due to fatigue, RN made aware. Will attempt to make-up missed time as able.    Therapy Documentation Precautions:  Precautions Precautions: Fall, Back Precaution Comments: no brace needed Required Braces or Orthoses: Sling Restrictions Weight Bearing Restrictions: Yes RUE Weight Bearing: Non weight bearing LLE Weight Bearing: Weight bearing as tolerated Other Position/Activity Restrictions: R clavicle fracture - NWB in sling x2-3 weeks General: PT Amount of Missed Time (min): 18 Minutes PT Missed Treatment Reason: Patient fatigue Agitated Behavior Scale: TBI Observation Details Observation Environment: Patient's room Start of observation period - Date: 04/23/21 Start of observation period - Time: 1005 End of observation period - Date: 04/23/21 End of observation period - Time: 1047 Agitated Behavior Scale (DO NOT LEAVE BLANKS) Short attention span, easy distractibility, inability to concentrate: Present to an extreme degree Impulsive, impatient, low tolerance for pain or frustration: Present to a moderate degree Uncooperative,  resistant to care, demanding: Present  to a moderate degree Violent and/or threatening violence toward people or property: Absent Explosive and/or unpredictable anger: Absent Rocking, rubbing, moaning, or other self-stimulating behavior: Absent Pulling at tubes, restraints, etc.: Absent Wandering from treatment areas: Absent Restlessness, pacing, excessive movement: Present to a slight degree Repetitive behaviors, motor, and/or verbal: Present to a slight degree Rapid, loud, or excessive talking: Absent Sudden changes of mood: Absent Easily initiated or excessive crying and/or laughter: Absent Self-abusiveness, physical and/or verbal: Absent Agitated behavior scale total score: 23    Therapy/Group: Individual Therapy  Dodge Ator L Shloimy Michalski PT, DPT  04/23/2021, 4:07 PM

## 2021-04-23 NOTE — Progress Notes (Signed)
Occupational Therapy TBI Note  Patient Details  Name: Sherry Montes MRN: [id number] Date of Birth: [birth year]  Today's Date: 04/23/2021 OT Individual Time: 9528-4132 OT Individual Time Calculation (min): 53 min  and Today's Date: 04/23/2021 OT Missed Time: 22 Minutes Missed Time Reason: Patient fatigue   Short Term Goals: Week 1:  OT Short Term Goal 1 (Week 1): Pt will complete UB dressing with min assist. OT Short Term Goal 2 (Week 1): Pt will complete LB dressing with min assist. OT Short Term Goal 3 (Week 1): Pt will complete toileting with mod assist OT Short Term Goal 4 (Week 1): Pt will follow one step instructions for completion of basic self care task needing mod multimodal cues.  Skilled Therapeutic Interventions/Progress Updates:    Pt greeted semi-reclined in bed awake with lights off and TV on. Pt able to answer OT questions with encouragement, as therapist trying to build rapport with patient. Pt shaking head no when asked to initiate any functional task. Pt falling back asleep intermittently and needed increased time, environmental changes with lighting and HOB raised as well as wash cloth on face to wake back up. Eventually, OT was able to get pt to initiate long sitting in bed to drink some coconut water. Pt resistive to use R UE and needed verbal and tactile cues to use R hand to place lid back on cup. Pt also ate her chocolate chip cookie sitting up with cues. Pt then returned to supine and tried to close her eyes again. OT able to get pt to agree to donning pants with OT threading pants in bed, then pt initiated hip bridge to pull them up. Pt began being sexually inappropriate with OT, grabbing therapists hand and trying to place my hand on her breast. Pt also saying inappropriate comments asking therapist to "touch my boobies." OT redirected pt and educated on inappropriateness of comments and actions. OT provided max A to get pt to sitting EOB. At EOB, OT assisted  with max A UB dressing due to distraction and poor initiation. OT tried to elicit sit<>stand to pull  pants up further, but pt became agitated and pulled her legs back in bed and closed her eyes. OT unable to wake pt again and pt missed 22 minutes of OT treatment session. Pt left semi-reclined in bed with bed alarm on and needs met.   Therapy Documentation Precautions:  Precautions Precautions: Fall, Back Precaution Comments: no brace needed Required Braces or Orthoses: Sling Restrictions Weight Bearing Restrictions: Yes RUE Weight Bearing: Non weight bearing LLE Weight Bearing: Weight bearing as tolerated Other Position/Activity Restrictions: R clavicle fracture - NWB in sling x2-3 weeks General: General OT Amount of Missed Time: 22 Minutes Pain:  Reports pain in L Leg when prompted Agitated Behavior Scale: TBI Observation Details Observation Environment: CIR Start of observation period - Date: 04/23/21 Start of observation period - Time: 0736 End of observation period - Date: 04/23/21 End of observation period - Time: 0830 Agitated Behavior Scale (DO NOT LEAVE BLANKS) Short attention span, easy distractibility, inability to concentrate: Present to an extreme degree Impulsive, impatient, low tolerance for pain or frustration: Present to an extreme degree Uncooperative, resistant to care, demanding: Present to an extreme degree Violent and/or threatening violence toward people or property: Absent Explosive and/or unpredictable anger: Absent Rocking, rubbing, moaning, or other self-stimulating behavior: Absent Pulling at tubes, restraints, etc.: Absent Wandering from treatment areas: Absent Restlessness, pacing, excessive movement: Present to a slight degree Repetitive behaviors, motor, and/or  verbal: Present to a slight degree Rapid, loud, or excessive talking: Absent Sudden changes of mood: Present to a slight degree Easily initiated or excessive crying and/or laughter:  Absent Self-abusiveness, physical and/or verbal: Absent Agitated behavior scale total score: 26   Therapy/Group: Individual Therapy  Valma Cava 04/23/2021, 8:42 AM

## 2021-04-23 NOTE — Progress Notes (Signed)
Patient has refused all meds today, repeated attempts to get her to void were refused. Patient also refused the in and out cath. Writer explained to the patient the problems that can occur if her bladder remains to full for an extended period of time. Patient still refused.

## 2021-04-23 NOTE — Progress Notes (Signed)
Patient ID: Sherry Montes, female   DOB: 2000-05-18, 20 y.o.   MRN: [id number]  SW made effort to meet with pt to complete assessment, however, pt did not speak with SW. Pt would only nod her head if in agreement with questions asked or just remain silent.   1313- SW spoke with pt Uncle Pinakin Oza [phone]) to introduce self, explain role, discharge process, and ELOS 14-16 days. He reported he is working on trying to get her parents here. States that he will continue to remain here to assist. He asked about pt insurance coverage. SW provided updates based on what is on pre-admission note. SW shared pt is not eligible for [facility] and outpatient services only allows 12 visits all together. SW shared if other services are needed such as aide services it would be out of pocket expense. He would like to know if she is eligible for Medicaid. SW informed it is not likely she can get services because she is here on a student VISA and typically you have to be here for 5 years before eligible for services. SW provided contact information and encouraged him to call. He is aware SW will follow-up with updates after team conference tomorrow.   Cecile Sheerer, MSW, LCSWA Office: [phone] Cell: [phone] Fax: [phone]

## 2021-04-23 NOTE — Progress Notes (Signed)
Pt c/o of bilateral leg pain. Prn medication offered but pt continues to refuse.

## 2021-04-23 NOTE — Progress Notes (Signed)
Inpatient Rehabilitation Care Coordinator Assessment and Plan Patient Details  Name: Sherry Montes MRN: [id number] Date of Birth: 2000/12/01  Today's Date: 04/23/2021  Hospital Problems: Principal Problem:   TBI (traumatic brain injury)  Past Medical History: History reviewed. No pertinent past medical history. Past Surgical History:  Past Surgical History:  Procedure Laterality Date   NO PAST SURGERIES     Social History:  reports that she has never smoked. She has never used smokeless tobacco. She reports that she does not drink alcohol and does not use drugs.  Family / Support Systems Marital Status: Single Spouse/Significant Other: N/a Children: N/A Other Supports: pt aunt and uncle Anticipated Caregiver: TBD Ability/Limitations of Caregiver: TBD Caregiver Availability: Other (Comment) (pending care needs on when parents are able to arrive from [place].) Family Dynamics: Pt was here from [place] on Lawyer. Pt was living with family friends  Social History Preferred language: English Religion:  Cultural Background: Pt is a Consulting civil engineer at [employer]: current matriculation at [employer] - How often do you need to have someone help you when you read instructions, pamphlets, or other written material from your doctor or pharmacy?: Patient unable to respond Writes: Yes Employment Status: Unemployed Marine scientist Issues: Unable to obtain Guardian/Conservator: N/A   Abuse/Neglect Abuse/Neglect Assessment Can Be Completed: Unable to assess, patient is non-responsive or altered mental status Physical Abuse: Denies Verbal Abuse: Denies Sexual Abuse: Denies Exploitation of patient/patient's resources: Denies Self-Neglect: Denies  Patient response to: Social Isolation - How often do you feel lonely or isolated from those around you?: Patient unable to respond  Emotional Status Pt's affect, behavior and adjustment status: Pt unable to  speak with SW without using nonvebral communication such as nodding head yes/no. Recent Psychosocial Issues: Unable to obtain information Psychiatric History: Unable to obtain information Substance Abuse History: Unable to obtain information  Patient / Family Perceptions, Expectations & Goals Pt/Family understanding of illness & functional limitations: Pt uncle has a general understanding of pt care needs Premorbid pt/family roles/activities: Independent Anticipated changes in roles/activities/participation: Assistance with ADLs/IADLs  Recruitment consultant Agencies: None Premorbid Home Care/DME Agencies: None Transportation available at discharge: TBD In the past 12 months, has lack of transportation kept you from medical appointments or from getting medications?: No In the past 12 months, has lack of transportation kept you from meetings, work, or from getting things needed for daily living?: No Resource referrals recommended: Neuropsychology  Discharge Planning Living Arrangements: Non-relatives/Friends Support Systems: Other relatives, Friends/neighbors Type of Residence: Private residence Insurance Resources: Media planner (specify) Financial Resources: Family Support Financial Screen Referred: No Living Expenses: Other (Comment) (lives with family friends) Money Management: Patient Does the patient have any problems obtaining your medications?: No Home Management: Pt helped manage home care needs Patient/Family Preliminary Plans: TBD Care Coordinator Barriers to Discharge: Decreased caregiver support, Insurance for SNF coverage, Lack of/limited family support Care Coordinator Anticipated Follow Up Needs: HH/OP Expected length of stay: 10-14 days  Clinical Impression Assessment completed with collateral information from chart.   Sherry Montes A Sherry Montes 04/23/2021, 4:54 PM
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
## 2021-04-23 NOTE — Progress Notes (Signed)
Patient refused all medications and refused to let me remove suture. Restraint order is active if needed over night. Patient surrently sleeping, will continue to monitor need for restraints

## 2021-04-23 NOTE — Plan of Care (Signed)
Behavioral Plan   [place] Level: IV  Behavior to decrease/ eliminate:   -Sexually inappropriate behavior -Agitation (throwing objects) -Not taking medications -lethargy -decreased participation  Changes to environment:  -lights on/blinds open during day to maximize wakefulness -Do not leave any items within reach when not supervised -Patient is NWB RUE, sling for comfort but will likely not tolerate it. Be mindful of RUE during transfers   Interventions: -bed alarm -telesitter -enclosure bed  Recommendations for interactions with patient: -Encourage verbalizations and decrease use of gestures  -Be mindful of body postioning  -If patient is touching inappropriately, redirect her hands gently and be direct when telling her that the behavior is inappropriate  Attendees:    Malachi Pro, PT Serina Cowper, PT  Kearney Hard, OT Feliberto Gottron, SLP

## 2021-04-23 NOTE — Progress Notes (Signed)
Inpatient Rehabilitation  Patient information reviewed and entered into eRehab system by Rachyl Wuebker M. Nathalee Smarr, M.A., CCC/SLP, PPS Coordinator.  Information including medical coding, functional ability and quality indicators will be reviewed and updated through discharge.    
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
## 2021-04-24 NOTE — Progress Notes (Signed)
Occupational Therapy TBI Note  Patient Details  Name: Sherry Montes MRN: [id number] Date of Birth: 08/16/2000  Today's Date: 04/24/2021 OT Individual Time: 1305-1400 OT Individual Time Calculation (min): 55 min    Short Term Goals: Week 1:  OT Short Term Goal 1 (Week 1): Pt will complete UB dressing with min assist. OT Short Term Goal 2 (Week 1): Pt will complete LB dressing with min assist. OT Short Term Goal 3 (Week 1): Pt will complete toileting with mod assist OT Short Term Goal 4 (Week 1): Pt will follow one step instructions for completion of basic self care task needing mod multimodal cues.  Skilled Therapeutic Interventions/Progress Updates:    Pt greeted semi-reclined in bed with eyes closed. OT turned on lights and pt able to wake and maintain alertness. OT provided verbal and tactile cues and was able to get pt to sit EOB with min A. OT noted brief was wet and tried to encourage pt to get to [facility]. Pt was able to get to partial stand with mod A, then said "no, no" and returned to sitting then back to bed. OT attempted tactile cues again to get pt back into sitting after rest break and pt attempted to swat at therapist. Pt attempting to grab therapists breasts throughout session requiring OT to gently pull her hands away from Therapist. OT able to redirect pt to remove pants and brief to wash her own peri-area. Pt able to follow commands and attend to this task, then became sexual inappropriate again, masturbating and saying OT's name. Pt also began humping the bed rail while continuing to make vulgar comments.  Tactile cues to cease behavior and get clean brief back on. OT was able to redirect pt to thread her pants back on with min A to thread L leg, then pt able to initiate bridging in bed to pull them up. Throughout dressing tasks, pt continued with inappropriate remarks. OT attempted to redirect pt with her lunch. Pt threw her cookie when handed to her. She then agreed to eat  some ice cream, but only stirred ice cream and handed it back to therapist, she did not throw it. OT  attempted to ignore inappropriate comments and gestures while attempting to redirect her to functional tasks. Pt extremely distracted by hypersexuality. OT provided pt with coloring sheet and she was able to choose a color she wanted from a field of two. Pt then attended to color one corner of the page for a little more than a minute. Pt then closed her eyes and refused to participate further. Pt left semi-reclined in bed with bed alarm on, call bell in reach, and needs met.   Therapy Documentation Precautions:  Precautions Precautions: Fall, Back Precaution Comments: no brace needed Required Braces or Orthoses: Sling Restrictions Weight Bearing Restrictions: Yes RUE Weight Bearing: Non weight bearing LLE Weight Bearing: Weight bearing as tolerated Other Position/Activity Restrictions: R clavicle fracture - NWB in sling x2-3 weeks Pain:  Denies pain Agitated Behavior Scale: TBI Observation Details Observation Environment: CIR Start of observation period - Date: 04/24/21 Start of observation period - Time: 1130 End of observation period - Date: 04/24/21 End of observation period - Time: 1200 Agitated Behavior Scale (DO NOT LEAVE BLANKS) Short attention span, easy distractibility, inability to concentrate: Present to an extreme degree Impulsive, impatient, low tolerance for pain or frustration: Present to a slight degree Uncooperative, resistant to care, demanding: Present to a slight degree Violent and/or threatening violence toward people or property:  Absent Explosive and/or unpredictable anger: Absent Rocking, rubbing, moaning, or other self-stimulating behavior: Absent Pulling at tubes, restraints, etc.: Absent Wandering from treatment areas: Absent Restlessness, pacing, excessive movement: Present to a slight degree Repetitive behaviors, motor, and/or verbal: Present to a slight  degree Rapid, loud, or excessive talking: Absent Sudden changes of mood: Absent Easily initiated or excessive crying and/or laughter: Absent Self-abusiveness, physical and/or verbal: Absent Agitated behavior scale total score: 21   Therapy/Group: Individual Therapy  Valma Cava 04/24/2021, 1:30 PM

## 2021-04-24 NOTE — Progress Notes (Signed)
Physical Therapy TBI Note  Patient Details  Name: Sherry Montes MRN: [id number] Date of Birth: 31-Aug-2000  Today's Date: 04/24/2021 PT Individual Time: 4098-1191 PT Individual Time Calculation (min): 47 min   Short Term Goals: Week 1:  PT Short Term Goal 1 (Week 1): Patient will participate in at least 75% of her scheduled therapy PT Short Term Goal 2 (Week 1): Patient will transfer bed <> wc with LRAD and ModA x1 PT Short Term Goal 3 (Week 1): Patient will ambulate >5 ft with LRAD and +2 assist as needed  Skilled Therapeutic Interventions/Progress Updates:     Patient in bed upon PT arrival. Patient alert and agreeable to PT session. Patient indicated R clavicle pain and L "knee" pain during session, declined taking medication at this time, RN made aware. PT provided repositioning, rest breaks, and distraction as pain interventions throughout session.   Patient with improved behaviors and verbalization this session. No sexually inappropriate behaviors, did want therapist to touch her, but appropriately on her arm or hand throughout session. Patient demonstrated slow, but improved initiation and participation throughout session. Continues to do well with 2 choice selection of tasks. Required x2 quiet sitting rest breaks and x1 lying rest break for improved participation. Provided patient with a set amount of time to rest, 2 min or 5 min, then initiated return to activity.   Therapeutic Activity: Bed Mobility: Patient performed supine to sit with min A in a flat bed and on a mat table and sit to supine with supervision and min A onto mat table due to patient spontaneously crawling onto the table initially. Provided verbal cues for safety awareness and initiation. Transfers: Patient performed stand pivot bed<>w/c with min A and sit to/from stand x3 with min A +2. Provided verbal cues for initiation and safety awareness.  Gait Training:  Patient ambulated 16 feet and 10 feet x2 with  +2 min A using B HHA with minimal weight bearing through R arm as able for balance/safety. Ambulated with mild ataxic gait, decreased weight bearing and weight shift to the L, forward and R trunk lean, antalgic gait on L, and significantly decreased gait speed. Provided verbal cues for erect posture and sequencing.  Neuromuscular Re-ed: Patient performed the following activities: -patient self-selected using the NuStep, attended to B lower extremity use x1.5 min before stopping, patient provided a seated rest break and unable to engage patient in the task again -patient sat at a table in the w/c and able to pair colored bean bags to colored dots, then able to place a colored dot between 2 colors that make up that color (ex. Blue and red = purple) x3, all performed quickly and correctly, however, patient unable to name the colors or items that are each color with max cues.   Patient with poor attention and leaning on therapist reporting fatigue and agreeable to return to the room.   Patient in bed at end of session with breaks locked, bed alarm set, Telesitter in place and all needs within reach. Patient missed 13 min of skilled PT due to fatigue, RN made aware. Will attempt to make-up missed time as able.    Therapy Documentation Precautions:  Precautions Precautions: Fall, Back Precaution Comments: no brace needed Required Braces or Orthoses: Sling Restrictions Weight Bearing Restrictions: Yes RUE Weight Bearing: Non weight bearing LLE Weight Bearing: Weight bearing as tolerated Other Position/Activity Restrictions: R clavicle fracture - NWB in sling x2-3 weeks  Agitated Behavior Scale: TBI Observation Details Observation Environment:  CIR Start of observation period - Date: 04/24/21 Start of observation period - Time: 0805 End of observation period - Date: 04/24/21 End of observation period - Time: 0847 Agitated Behavior Scale (DO NOT LEAVE BLANKS) Short attention span, easy  distractibility, inability to concentrate: Present to a moderate degree Impulsive, impatient, low tolerance for pain or frustration: Present to a slight degree Uncooperative, resistant to care, demanding: Present to a slight degree Violent and/or threatening violence toward people or property: Absent Explosive and/or unpredictable anger: Absent Rocking, rubbing, moaning, or other self-stimulating behavior: Absent Pulling at tubes, restraints, etc.: Absent Wandering from treatment areas: Absent Restlessness, pacing, excessive movement: Absent Repetitive behaviors, motor, and/or verbal: Present to a slight degree Rapid, loud, or excessive talking: Absent Sudden changes of mood: Absent Easily initiated or excessive crying and/or laughter: Absent Self-abusiveness, physical and/or verbal: Absent Agitated behavior scale total score: 19    Therapy/Group: Individual Therapy  Joseph Johns L Regenia Erck PT, DPT  04/24/2021, 8:56 AM

## 2021-04-24 NOTE — Progress Notes (Signed)
Physical Therapy Session Note  Patient Details  Name: Sherry Montes MRN: [id number] Date of Birth: 07/23/2000  Today's Date: 04/24/2021 PT Missed Time: 30 Minutes Missed Time Reason: Patient fatigue  Short Term Goals: Week 1:  PT Short Term Goal 1 (Week 1): Patient will participate in at least 75% of her scheduled therapy PT Short Term Goal 2 (Week 1): Patient will transfer bed <> wc with LRAD and ModA x1 PT Short Term Goal 3 (Week 1): Patient will ambulate >5 ft with LRAD and +2 assist as needed  Skilled Therapeutic Interventions/Progress Updates:      Pt awake but will not open eyes for PT, despite multiple attempts at arousal. PT will follow up as able.  Therapy Documentation Precautions:  Precautions Precautions: Fall, Back Precaution Comments: no brace needed Required Braces or Orthoses: Sling Restrictions Weight Bearing Restrictions: Yes RUE Weight Bearing: Non weight bearing LLE Weight Bearing: Weight bearing as tolerated Other Position/Activity Restrictions: R clavicle fracture - NWB in sling x2-3 weeks General: PT Amount of Missed Time (min): 30 Minutes PT Missed Treatment Reason: Patient fatigue    Therapy/Group: Individual Therapy  Beau Fanny, PT DPT 04/24/2021, 4:27 PM

## 2021-04-24 NOTE — Progress Notes (Signed)
Physical Therapy TBI Note  Patient Details  Name: Sherry Montes MRN: [id number] Date of Birth: [birth year]  Today's Date: 04/24/2021 PT Individual Time: 1130-1200 PT Individual Time Calculation (min): 30 min   Short Term Goals: Week 1:  PT Short Term Goal 1 (Week 1): Patient will participate in at least 75% of her scheduled therapy PT Short Term Goal 2 (Week 1): Patient will transfer bed <> wc with LRAD and ModA x1 PT Short Term Goal 3 (Week 1): Patient will ambulate >5 ft with LRAD and +2 assist as needed  Skilled Therapeutic Interventions/Progress Updates:    Patient received supine in bed, eating a kit kat, aunt at bedside, agreeable to PT. She denies pain by shaking her head "no" when asked. Patient initially non verbal, but attempting to communicate through gestures and facial expressions. Patient able to come sit edge of bed with supervision. ModA lateral scoot to wc. Most assist needed to have patient keep her feet on the floor as she tried turning backwards and putting her legs onto the bed before scooting into the chair. PT transporting patient in wc to therapy gym for time management and energy conservation. Aunt present throughout session. Patient standing with MinA x2 and then abruptly trying to sit. Aunt attempting to hold patient up to encourage her to ambulate/ remain standing- PT advising aunt to allow patient to sit safely. Patient making inappropriate sexual comments directed at female tech requiring frequent and firm redirection. Patient responsive to the redirection. She stood again with MinA x2 and then abruptly sat again. Patient indicating that she wanted to be transported in the wc around the unit. PT encouraging patient to vocalize her needs and wants. PT transporting patient in wc back to her room. She ambulated x8f with MinA x2 HHA to her bed. Generalized weakness, scissoring gait pattern without tone noted. Patient returning supine with supervision. Bed alarm on,  call light within reach. Aunt relaying to PT that patient has been facetiming family members inappropriately and notes that she took the patients phone home. PT informing aunt that it may be wise for the aunt to not bring her personal phone around the patient to limit distractions and availability for patient to use phone. Aunt also communicating to PT that she would like only females to work with the patient to limit patient distraction. PT relaying this to those that schedule, to which it was communicated back to this PT that that is not 100% feasible due to "staffing constraints."   Therapy Documentation Precautions:  Precautions Precautions: Fall, Back Precaution Comments: no brace needed Required Braces or Orthoses: Sling Restrictions Weight Bearing Restrictions: Yes RUE Weight Bearing: Non weight bearing LLE Weight Bearing: Weight bearing as tolerated Other Position/Activity Restrictions: R clavicle fracture - NWB in sling x2-3 weeks  Agitated Behavior Scale: TBI Observation Details Observation Environment: CIR Start of observation period - Date: 04/24/21 Start of observation period - Time: 1130 End of observation period - Date: 04/24/21 End of observation period - Time: 1200 Agitated Behavior Scale (DO NOT LEAVE BLANKS) Short attention span, easy distractibility, inability to concentrate: Present to a moderate degree Impulsive, impatient, low tolerance for pain or frustration: Present to a slight degree Uncooperative, resistant to care, demanding: Present to a slight degree Violent and/or threatening violence toward people or property: Absent Explosive and/or unpredictable anger: Absent Rocking, rubbing, moaning, or other self-stimulating behavior: Absent Pulling at tubes, restraints, etc.: Absent Wandering from treatment areas: Absent Restlessness, pacing, excessive movement: Absent Repetitive behaviors, motor, and/or  verbal: Present to a slight degree Rapid, loud, or  excessive talking: Absent Sudden changes of mood: Absent Easily initiated or excessive crying and/or laughter: Absent Self-abusiveness, physical and/or verbal: Absent Agitated behavior scale total score: 19    Therapy/Group: Individual Therapy  Karoline Caldwell, PT, DPT, CBIS  04/24/2021, 9:34 AM

## 2021-04-24 NOTE — Progress Notes (Signed)
Removed 2 sutures from patients left ankle. Will continue to attempt to remove sutures as patient will allow

## 2021-04-24 NOTE — Plan of Care (Signed)
°  Problem: RH Expression Communication Goal: LTG Patient will verbally express basic/complex needs(SLP) Description: LTG:  Patient will verbally express basic/complex needs, wants or ideas with cues  (SLP) Flowsheets (Taken 04/24/2021 0631) LTG: Patient will verbally express basic/complex needs, wants or ideas (SLP): Supervision Note: Goal added

## 2021-04-25 MED ORDER — METHOCARBAMOL 500 MG PO TABS
1000.0000 mg | ORAL_TABLET | Freq: Three times a day (TID) | ORAL | Status: DC | PRN
  Filled 2021-04-25 (×2): qty 2

## 2021-04-25 NOTE — Progress Notes (Signed)
Physical Therapy Session Note  Patient Details  Name: Sherry Montes MRN: [id number] Date of Birth: 06/09/2000  Today's Date: 04/25/2021 PT Individual Time: 0915-1015 PT Individual Time Calculation (min): 60 min  and Today's Date: 04/25/2021 PT Missed Time: 15 Minutes Missed Time Reason: Patient fatigue  Short Term Goals: Week 1:  PT Short Term Goal 1 (Week 1): Patient will participate in at least 75% of her scheduled therapy PT Short Term Goal 2 (Week 1): Patient will transfer bed <> wc with LRAD and ModA x1 PT Short Term Goal 3 (Week 1): Patient will ambulate >5 ft with LRAD and +2 assist as needed  Skilled Therapeutic Interventions/Progress Updates:     Pt misses x15 minutes of skilled PT due to being asleep upon PT entry. Pt lying prone and does not respond to verbal or tactile stimuli. PT and rehab tech then utilize chuck pad to maneuver pt into supine. Pt then opens eyes and is agreeable to participate in therapy. Supine to sit with modA. Pt makes several inappropriate sexual motions and suggestions, but is able to be redirected. Stand pivot transfer to [facility] with minA +2. WC transport to gym for time management. Pt transfers to mat table with modA +1 and cues for body mechanics and sequencing. Seated at mat table, PT attempts to engage pt in conversation but pt is mostly non-verbal. Multiple reps of sit to stand with min to modA +2, depending on pt initiation. Pt is able to ambulate x25' with modA +2 and +3 for WC follow for safety. PT provides cues for sequencing, weight shifting and tactile cueing at hips to encourage extension as pt attempts to flex forward ands sit back several times.  Pt takes extended seated rest break and requests several times to return to room. PT redirects as well as educating on inappropriateness of sexual remarks. Pt then agreeable to ambulate back to room. Pt ambulates x100' with modA +2. PT provides cueing to pt to not utilize R upper extremity due to  WB precautions, but pt continuously attempts to hold onto PT's shoulders or collar of shirt.   Pt left supine with alarm intact and all needs within reach.  Therapy Documentation Precautions:  Precautions Precautions: Fall, Back Precaution Comments: no brace needed Required Braces or Orthoses: Sling Restrictions Weight Bearing Restrictions: Yes RUE Weight Bearing: Non weight bearing LLE Weight Bearing: Weight bearing as tolerated Other Position/Activity Restrictions: R clavicle fracture - NWB in sling x2-3 weeks   Therapy/Group: Individual Therapy  Beau Fanny, PT, DPT 04/25/2021, 12:56 PM
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
## 2021-04-25 NOTE — Progress Notes (Signed)
Patient's family is requesting doctor to call the father, who is not able to come visit, with updates. Family stated father had a lot of questions, nurse offered to answer questions, but family says father wants doctor to directly call him. Name is Stann Ore, [phone]
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
## 2021-04-25 NOTE — Progress Notes (Signed)
Physical Therapy TBI Note  Patient Details  Name: Sherry Montes MRN: [id number] Date of Birth: 12-29-2000  Today's Date: 04/25/2021 PT Individual Time: 1400-1410 PT Individual Time Calculation (min): 10 min   Short Term Goals: Week 1:  PT Short Term Goal 1 (Week 1): Patient will participate in at least 75% of her scheduled therapy PT Short Term Goal 2 (Week 1): Patient will transfer bed <> wc with LRAD and ModA x1 PT Short Term Goal 3 (Week 1): Patient will ambulate >5 ft with LRAD and +2 assist as needed  Skilled Therapeutic Interventions/Progress Updates:     Patient in bed asleep upon PT arrival. Patient slow to arouse to verbal and tactile stimulation. Offered to get patient OOB to wash her hair and have her sheets changed. Patient politely declined. PT offered toileting and functional mobility following commending her on her progress in earlier therapy sessions. Patient reports she does not recall events of earlier sessions, but feels tired, "like I did all that."  NT arrived to take vitals, patient agreeable and appropriate with NT while vitals were taken. Noted untouched lunch tray, patient declined eating food, PT showed patient the tray and food options without success. PT attempted to facilitate transitioning patient into sitting and patient became upset then reported L ankle pain. Patient then closed her eyes and would no longer respond to PT. Patient missed 20 min of skilled PT due to fatigue/behaviors, RN made aware. Will attempt to make-up missed time as able.    Therapy Documentation Precautions:  Precautions Precautions: Fall, Back Precaution Comments: no brace needed Required Braces or Orthoses: Sling Restrictions Weight Bearing Restrictions: Yes RUE Weight Bearing: Non weight bearing LLE Weight Bearing: Weight bearing as tolerated Other Position/Activity Restrictions: R clavicle fracture - NWB in sling x2-3 weeks General: PT Amount of Missed Time (min): 20  Minutes PT Missed Treatment Reason: Patient fatigue Agitated Behavior Scale: TBI Observation Details Observation Environment: pt room Start of observation period - Date: 04/25/21 Start of observation period - Time: 1400 End of observation period - Date: 04/25/21 End of observation period - Time: 1410 Agitated Behavior Scale (DO NOT LEAVE BLANKS) Short attention span, easy distractibility, inability to concentrate: Present to a slight degree Impulsive, impatient, low tolerance for pain or frustration: Present to a moderate degree Uncooperative, resistant to care, demanding: Present to a moderate degree Violent and/or threatening violence toward people or property: Absent Explosive and/or unpredictable anger: Absent Rocking, rubbing, moaning, or other self-stimulating behavior: Absent Pulling at tubes, restraints, etc.: Absent Wandering from treatment areas: Absent Restlessness, pacing, excessive movement: Absent Repetitive behaviors, motor, and/or verbal: Absent Rapid, loud, or excessive talking: Absent Sudden changes of mood: Absent Easily initiated or excessive crying and/or laughter: Absent Self-abusiveness, physical and/or verbal: Absent Agitated behavior scale total score: 19    Therapy/Group: Individual Therapy  Sherry Montes L Stephenia Vogan PT, DPT  04/25/2021, 5:26 PM

## 2021-04-25 NOTE — Progress Notes (Signed)
Occupational Therapy TBI Note  Patient Details  Name: Sherry Montes MRN: [id number] Date of Birth: [birth year]  Today's Date: 04/25/2021 OT Individual Time: 1040-1050 OT Individual Time Calculation (min): 10 min  and Today's Date: 04/25/2021 OT Missed Time: 20 Minutes Missed Time Reason: Patient unwilling/refused to participate without medical reason   Short Term Goals: Week 1:  OT Short Term Goal 1 (Week 1): Pt will complete UB dressing with min assist. OT Short Term Goal 2 (Week 1): Pt will complete LB dressing with min assist. OT Short Term Goal 3 (Week 1): Pt will complete toileting with mod assist OT Short Term Goal 4 (Week 1): Pt will follow one step instructions for completion of basic self care task needing mod multimodal cues.  Skilled Therapeutic Interventions/Progress Updates:    Pt supine, with eyes closed. Pt opening her eyes to tactile stimuli and shaking head no. Family left the room and pt kept her eyes open. Pt agreeable to OT trying to brush her matted hair. After several minutes she asked OT to stop and declined participation in any other activity. She kept her eyes closed and actively resisted OT efforts to move her in the bed. Pt left supine with all needs met, bed alarm set.   Therapy Documentation Precautions:  Precautions Precautions: Fall, Back Precaution Comments: no brace needed Required Braces or Orthoses: Sling Restrictions Weight Bearing Restrictions: Yes RUE Weight Bearing: Non weight bearing LLE Weight Bearing: Weight bearing as tolerated Other Position/Activity Restrictions: R clavicle fracture - NWB in sling x2-3 weeks  Agitated Behavior Scale: TBI Observation Details Observation Environment: pt room Start of observation period - Date: 04/25/21 Start of observation period - Time: 1040 End of observation period - Date: 04/25/21 End of observation period - Time: 1050 Agitated Behavior Scale (DO NOT LEAVE BLANKS) Short attention span,  easy distractibility, inability to concentrate: Present to a slight degree Impulsive, impatient, low tolerance for pain or frustration: Absent Uncooperative, resistant to care, demanding: Present to a slight degree Violent and/or threatening violence toward people or property: Absent Explosive and/or unpredictable anger: Absent Rocking, rubbing, moaning, or other self-stimulating behavior: Absent Pulling at tubes, restraints, etc.: Absent Wandering from treatment areas: Absent Restlessness, pacing, excessive movement: Absent Repetitive behaviors, motor, and/or verbal: Absent Rapid, loud, or excessive talking: Absent Sudden changes of mood: Absent Easily initiated or excessive crying and/or laughter: Absent Self-abusiveness, physical and/or verbal: Absent Agitated behavior scale total score: 16   Therapy/Group: Individual Therapy  Curtis Sites 04/25/2021, 12:41 PM

## 2021-04-26 ENCOUNTER — Encounter (HOSPITAL_COMMUNITY): Payer: Self-pay | Admitting: Orthopedic Surgery

## 2021-04-26 DIAGNOSIS — E440 Moderate protein-calorie malnutrition: Secondary | ICD-10-CM

## 2021-04-26 DIAGNOSIS — D62 Acute posthemorrhagic anemia: Secondary | ICD-10-CM | POA: Diagnosis not present

## 2021-04-26 DIAGNOSIS — S069XAS Unspecified intracranial injury with loss of consciousness status unknown, sequela: Secondary | ICD-10-CM | POA: Diagnosis not present

## 2021-04-26 DIAGNOSIS — S069X3S Unspecified intracranial injury with loss of consciousness of 1 hour to 5 hours 59 minutes, sequela: Secondary | ICD-10-CM | POA: Diagnosis not present

## 2021-04-26 DIAGNOSIS — F02818 Dementia in other diseases classified elsewhere, unspecified severity, with other behavioral disturbance: Secondary | ICD-10-CM
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
## 2021-04-26 NOTE — Progress Notes (Signed)
Patient refusing anticoagulation therapy charge nurse and Dr. Riley Kill notified according to [facility] protocol

## 2021-04-26 NOTE — Progress Notes (Signed)
Physical Therapy Session Note  Patient Details  Name: Bernice Mcauliffe MRN: [id number] Date of Birth: [birth year]  Today's Date: 04/26/2021 PT Missed Time: 60 Minutes Missed Time Reason: Patient fatigue  Short Term Goals: Week 1:  PT Short Term Goal 1 (Week 1): Patient will participate in at least 75% of her scheduled therapy PT Short Term Goal 2 (Week 1): Patient will transfer bed <> wc with LRAD and ModA x1 PT Short Term Goal 3 (Week 1): Patient will ambulate >5 ft with LRAD and +2 assist as needed  Skilled Therapeutic Interventions/Progress Updates:     Pt received supine in bed asleep. Will not open eyes for therapist despite multiple attempts at arousal. PT will follow up as able.  Therapy Documentation Precautions:  Precautions Precautions: Fall, Back Precaution Comments: no brace needed Required Braces or Orthoses: Sling Restrictions Weight Bearing Restrictions: Yes RUE Weight Bearing: Non weight bearing LLE Weight Bearing: Weight bearing as tolerated Other Position/Activity Restrictions: R clavicle fracture - NWB in sling x2-3 weeks  Therapy/Group: Individual Therapy  Beau Fanny, PT, DPT 04/26/2021, 4:51 PM
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
## 2021-04-26 NOTE — Progress Notes (Signed)
Occupational Therapy TBI Note ° °Patient Details  °Name: Sherry Montes °MRN: [id number] °Date of Birth: 10/12/2000 ° °Today's Date: 04/26/2021 °OT Individual Time: 0947-1055 °OT Individual Time Calculation (min): 68 min  ° ° °Short Term Goals: °Week 1:  OT Short Term Goal 1 (Week 1): Pt will complete UB dressing with min assist. °OT Short Term Goal 2 (Week 1): Pt will complete LB dressing with min assist. °OT Short Term Goal 3 (Week 1): Pt will complete toileting with mod assist °OT Short Term Goal 4 (Week 1): Pt will follow one step instructions for completion of basic self care task needing mod multimodal cues. ° °Skilled Therapeutic Interventions/Progress Updates:  °Pt received asleep in supine needing max multimodal stimulus to arouse. Dr. Swartz present at beginning of session discussing importance of pt participating in therapy and working on eating/ drinking. Pt nodding at MD but did not verbalize understanding. Pt needed total A to transition supine>sit and was initially resistant to mobility aggressively removing OTAs hand from pts shoulder, however able to redirect. Pt did eat kit-kat from EOB and drank a couple sips of ginger ale. Pt able to stand from EOB with MIN A and MIN A +2 for safety to pivot to w/c. Pt completed grooming at sink including washing her face  while aunt brushed her hair. Pt agreeable to roll around the unit in w/c. Pt able to tolerate ~ 3 mins of christmas music in day room with large group present, transported pt around IR from w/c with total A to facilitate improved tolerance to multimodal stimulation. Pt tolerated all transport/ stimulation for short bouts of time, pt continued to be nonvebral during session but did nod "yes" and "no" when asking pt what certain items were with choices provided such as bed and car simulator.  Pt able to ambulate ~ 10 ft with MIN HHA +2 for safety to EOB. Pt complete sit>supine independently, pt left supine in bed with all needs within  reach and bed alarm activated.  Did get pt to drink a couple sips of ensure with straw placed in her mouth.  ° ° °Therapy Documentation °Precautions:  °Precautions °Precautions: Fall, Back °Precaution Comments: no brace needed °Required Braces or Orthoses: Sling °Restrictions °Weight Bearing Restrictions: Yes °RUE Weight Bearing: Non weight bearing °LLE Weight Bearing: Weight bearing as tolerated °Other Position/Activity Restrictions: R clavicle fracture - NWB in sling x2-3 weeks ° °  ° °Agitated Behavior Scale: °TBI °Observation Details °Observation Environment: CIR °Start of observation period - Date: 04/26/21 °Start of observation period - Time: 0947 °End of observation period - Date: 04/26/21 °End of observation period - Time: 1055 °Agitated Behavior Scale (DO NOT LEAVE BLANKS) °Short attention span, easy distractibility, inability to concentrate: Present to a slight degree °Impulsive, impatient, low tolerance for pain or frustration: Absent °Uncooperative, resistant to care, demanding: Present to a moderate degree °Violent and/or threatening violence toward people or property: Absent °Explosive and/or unpredictable anger: Absent °Rocking, rubbing, moaning, or other self-stimulating behavior: Absent °Pulling at tubes, restraints, etc.: Absent °Wandering from treatment areas: Absent °Restlessness, pacing, excessive movement: Absent °Repetitive behaviors, motor, and/or verbal: Absent °Rapid, loud, or excessive talking: Absent °Sudden changes of mood: Absent °Easily initiated or excessive crying and/or laughter: Absent °Self-abusiveness, physical and/or verbal: Absent °Agitated behavior scale total score: 17 °Pain: no pain reported during session  ° ° ° °Therapy/Group: Individual Therapy ° ° Kate Kanoy °04/26/2021, 12:14 PM °

## 2021-04-26 NOTE — Plan of Care (Signed)
Pt's plan of care adjusted to 15/7 after speaking with care team and discussed with MD in team conference as pt currently unable to tolerate current therapy schedule with OT, PT, and SLP.   ? ?Sherry Warga, MA, CCC-SLP ? ?

## 2021-04-27 DIAGNOSIS — S069X3S Unspecified intracranial injury with loss of consciousness of 1 hour to 5 hours 59 minutes, sequela: Secondary | ICD-10-CM | POA: Diagnosis not present

## 2021-04-27 DIAGNOSIS — S22080S Wedge compression fracture of T11-T12 vertebra, sequela: Secondary | ICD-10-CM | POA: Diagnosis not present

## 2021-04-27 DIAGNOSIS — G479 Sleep disorder, unspecified: Secondary | ICD-10-CM | POA: Diagnosis not present

## 2021-04-27 DIAGNOSIS — S82202S Unspecified fracture of shaft of left tibia, sequela: Secondary | ICD-10-CM | POA: Diagnosis not present

## 2021-04-27 LAB — CREATININE, SERUM
Creatinine, Ser: 0.66 mg/dL (ref 0.44–1.00)
GFR, Estimated: >60 mL/min (ref >60)

## 2021-04-27 MED ORDER — MEGESTROL ACETATE 400 MG/10ML PO SUSP
400.0000 mg | Freq: Two times a day (BID) | ORAL | Status: DC
  Administered 2021-04-27 – 2021-05-07 (×21): 400 mg via ORAL
  Filled 2021-04-27 (×22): qty 10

## 2021-04-27 NOTE — Progress Notes (Signed)
Physical Therapy Session Note  Patient Details  Name: Sherry Montes MRN: [id number] Date of Birth: 14-Jul-2000  Today's Date: 04/27/2021 PT Individual Time: 0800-0910 PT Individual Time Calculation (min): 70 min   Short Term Goals: Week 1:  PT Short Term Goal 1 (Week 1): Patient will participate in at least 75% of her scheduled therapy PT Short Term Goal 2 (Week 1): Patient will transfer bed <> wc with LRAD and ModA x1 PT Short Term Goal 3 (Week 1): Patient will ambulate >5 ft with LRAD and +2 assist as needed  Skilled Therapeutic Interventions/Progress Updates:      Pt received supine in bed asleep. Aroused with effort by PT. PT engaged pt in orienatation to place and situation. Pt initially nodding yes/no to questions about orientation with inconsistent accuracy. PT encouraged out of bed mobility and eating throughout session as well as conversation about family, school and work. With increased time, pt became more alert and participatory in conversation, noted to have >75% accuracy about family, school and work per chart review. Throughout session, pt became increasingly inappropriate required max redirection and education for improper sexual conduct and conversation with hospital staff. Pt was able to sit up in bed with supervision assist 4 times throughout session and then come to EOB x 2 with supervision assist and cues for safe use of RUE. No pain reported in R shoulder or LLE throughout session. Pt continued to decline any out of bed mobility despite encouragement from PT. Left sitting in bed with RN present at end of session.    Therapy Documentation Precautions:  Precautions Precautions: Fall, Back Precaution Comments: no brace needed Required Braces or Orthoses: Sling Restrictions Weight Bearing Restrictions: Yes RUE Weight Bearing: Non weight bearing LLE Weight Bearing: Weight bearing as tolerated Other Position/Activity Restrictions: R clavicle fracture - NWB in  sling x2-3 weeks  Pain:  Denies    Therapy/Group: Individual Therapy  Golden Pop 04/27/2021, 9:29 AM

## 2021-04-27 NOTE — Progress Notes (Signed)
Occupational Therapy Weekly Progress Note  Patient Details  Name: Sherry Montes MRN: [id number] Date of Birth: Feb 19, 2001  Beginning of progress report period: April 21, 2021 End of progress report period: April 27, 2021  Today's Date: 04/27/2021 OT Individual Time: 7342-8768 OT Individual Time Calculation (min): 55 min    Patient has met 4 of 4 short term goals.  Patient is making steady progress towards OT goals. She had some difficulty maintaining alertness to participate in beginning of the week,but has made progress as th week progressed. When she participates, she can completed stand-pivots with min/mod A and ambulate short distances with mod HHA. Pt can complete BADL tasks with min/mod A but needs cues for initiation and thoroughness of tasks. Pt continues to demonstrate inappropriateness with poor awareness of deficits, but slightly improved. Continue current POC.  Patient continues to demonstrate the following deficits: muscle weakness, decreased initiation, decreased attention, decreased awareness, decreased problem solving, decreased safety awareness, decreased memory, and delayed processing, and decreased sitting balance, decreased standing balance, decreased postural control, and decreased balance strategies and therefore will continue to benefit from skilled OT intervention to enhance overall performance with BADL and Reduce care partner burden.  Patient progressing toward long term goals..  Continue plan of care.  OT Short Term Goals Week 1:  OT Short Term Goal 1 (Week 1): Pt will complete UB dressing with min assist. OT Short Term Goal 1 - Progress (Week 1): Met OT Short Term Goal 2 (Week 1): Pt will complete LB dressing with min assist. OT Short Term Goal 2 - Progress (Week 1): Met OT Short Term Goal 3 (Week 1): Pt will complete toileting with mod assist OT Short Term Goal 3 - Progress (Week 1): Met OT Short Term Goal 4 (Week 1): Pt will follow one step  instructions for completion of basic self care task needing mod multimodal cues. OT Short Term Goal 4 - Progress (Week 1): Met Week 2:  OT Short Term Goal 1 (Week 2): Patient will complete toilet transfer with min A of 1 OT Short Term Goal 2 (Week 2): Patient will maintain attention to grooming task for 1 minute with moderate cues OT Short Term Goal 3 (Week 2): Patient will maintain standing balance at the sink with no more than min A in preparation for BADL tasks  Skilled Therapeutic Interventions/Progress Updates:   Pt greeted sidelying in bed awake with Aunties present. Pt agreeable to OT treatment session with encouragement and verbal, tactile cues. Pt completed stand-pivot to wc with min A. OT able to encourage pt to bathe and dress to get ready to go to the [place] party in the dayroom. Pt with some mild inappropriate remarks towards female rehab tech, so OT had rehab tech leave for bathing/dressing. Pt remained appropriate for all interactions with this female therapists. Pt needed cues for initiation and thoroughness for bathing at the sink. Min A for sit<>stands and min A for standing balance. Pt able to thread pant legs and pull them up with min A and min cues for initiation. Pt declined to brush her teeth stating she had already done that, but she had not. OT then ambulated with pt with mod HHA into bathroom to show her shower and toilet. Aunties encouraged her to get a shower soon. Pt then ambulated to therapy dayroom with mod HHA+2. Pt needed encouragement to keep going, but made it there. Pt took rest break, returned to rooom in wc. Pt walked back to bed with mod HHA  of 1 person. Pt became labile and teaful stating she misssed her parents. Provided emotional support. Attempted to get pt to eat icce cream. She took one bite, but would not eat more. Pt reported nausea and nursing notified. Pt left seated in enclosude bed with needs met and family present.   Therapy Documentation Precautions:   Precautions Precautions: Fall, Back Precaution Comments: no brace needed Required Braces or Orthoses: Sling Restrictions Weight Bearing Restrictions: Yes RUE Weight Bearing: Non weight bearing LLE Weight Bearing: Weight bearing as tolerated Other Position/Activity Restrictions: R clavicle fracture - NWB in sling x2-3 weeks Pain:  Denies pain   Therapy/Group: Individual Therapy  Valma Cava 04/27/2021, 1:04 PM
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
## 2021-04-28 DIAGNOSIS — S22080S Wedge compression fracture of T11-T12 vertebra, sequela: Secondary | ICD-10-CM | POA: Diagnosis not present

## 2021-04-28 DIAGNOSIS — S82202S Unspecified fracture of shaft of left tibia, sequela: Secondary | ICD-10-CM | POA: Diagnosis not present

## 2021-04-28 DIAGNOSIS — G479 Sleep disorder, unspecified: Secondary | ICD-10-CM | POA: Diagnosis not present

## 2021-04-28 DIAGNOSIS — S069X3S Unspecified intracranial injury with loss of consciousness of 1 hour to 5 hours 59 minutes, sequela: Secondary | ICD-10-CM | POA: Diagnosis not present

## 2021-04-28 LAB — COMPREHENSIVE METABOLIC PANEL
ALT: 18 U/L (ref 0–44)
AST: 28 U/L (ref 15–41)
Albumin: 4 g/dL (ref 3.5–5.0)
Alkaline Phosphatase: 187 U/L — ABNORMAL HIGH (ref 38–126)
Anion gap: 10 (ref 5–15)
BUN: 10 mg/dL (ref 6–20)
CO2: 26 mmol/L (ref 22–32)
Calcium: 10 mg/dL (ref 8.9–10.3)
Chloride: 102 mmol/L (ref 98–111)
Creatinine, Ser: 0.67 mg/dL (ref 0.44–1.00)
GFR, Estimated: >60 mL/min (ref >60)
Glucose, Bld: 115 mg/dL — ABNORMAL HIGH (ref 70–99)
Potassium: 3.9 mmol/L (ref 3.5–5.1)
Sodium: 138 mmol/L (ref 135–145)
Total Bilirubin: 0.8 mg/dL (ref 0.3–1.2)
Total Protein: 7.8 g/dL (ref 6.5–8.1)

## 2021-04-28 LAB — PREALBUMIN: Prealbumin: 29.3 mg/dL (ref 18–38)

## 2021-04-28 MED ORDER — PANTOPRAZOLE SODIUM 40 MG PO TBEC
40.0000 mg | DELAYED_RELEASE_TABLET | Freq: Every day | ORAL | Status: DC
  Administered 2021-04-28 – 2021-05-09 (×12): 40 mg via ORAL
  Filled 2021-04-28 (×12): qty 1
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
## 2021-04-28 NOTE — Progress Notes (Signed)
Physical Therapy TBI Note  Patient Details  Name: Sherry Montes MRN: [id number] Date of Birth: 11/12/2000  Today's Date: 04/28/2021 PT Individual Time: 1007-1040 PT Individual Time Calculation (min): 33 min   Today's Date: 04/28/2021 PT Missed Time: 27 Minutes Missed Time Reason: Other (Comment) (pt requesting to eat with assist from family)  Short Term Goals: Week 1:  PT Short Term Goal 1 (Week 1): Patient will participate in at least 75% of her scheduled therapy PT Short Term Goal 2 (Week 1): Patient will transfer bed <> wc with LRAD and ModA x1 PT Short Term Goal 3 (Week 1): Patient will ambulate >5 ft with LRAD and +2 assist as needed  Skilled Therapeutic Interventions/Progress Updates:   Received pt supine in bed with breakfast tray untouched. Therapist suggested eating some of breakfast, however pt declined and reported eating earlier but requesting to wait until aunt arrives. Pt then requested to call uncle and therapist assisted pt with calling uncle twice (pt able to remember number). Pt declined OOB mobility but requesting therapist sit and talk with her. Pt reported being in school for accounting and telling therapist how much she loves her aunt and uncle, explaining that her parents are in [place]. Then engaged in tic tac toe game with full participation x 3 rounds. Suggested playing card game and taught pt how to play "Go Fish" with min/mod cues for instructions. Pt's family arrived and requested therapist assist pt in brushing teeth. Pt declined sitting EOB but did brush teeth from bed level with set up assist. Pt then requested family sit with her and eat. Family aware of importance of notifying staff if/when they are planning to leave for safety. Concluded session with pt sidelying in bed with family present at bedside assisting pt with eating. 27 minutes missed of skilled physical therapy.   Therapy Documentation Precautions:  Precautions Precautions: Fall,  Back Precaution Comments: no brace needed Required Braces or Orthoses: Sling Restrictions Weight Bearing Restrictions: No RUE Weight Bearing: Non weight bearing LLE Weight Bearing: Weight bearing as tolerated Other Position/Activity Restrictions: R clavicle fracture - NWB in sling x2-3 weeks Agitated Behavior Scale: TBI         Therapy/Group: Individual Therapy Martin Majestic PT, DPT   04/28/2021, 7:52 AM

## 2021-04-28 NOTE — Progress Notes (Signed)
Occupational Therapy TBI Note  Patient Details  Name: Sherry Montes MRN: [id number] Date of Birth: 2000/10/15  Today's Date: 04/28/2021 OT Individual Time: 1350-1445 OT Individual Time Calculation (min): 55 min    Short Term Goals: Week 2:  OT Short Term Goal 1 (Week 2): Patient will complete toilet transfer with min A of 1 OT Short Term Goal 2 (Week 2): Patient will maintain attention to grooming task for 1 minute with moderate cues OT Short Term Goal 3 (Week 2): Patient will maintain standing balance at the sink with no more than min A in preparation for BADL tasks  Skilled Therapeutic Interventions/Progress Updates:  Skilled OT intervention completed with focus on cognitive strategies, increasing attention, participation in therapy, sitting balance, and sequencing. Pt received supine in bed with family present. Pt declining OOB activities or self-care tasks, but agreeable to seated EOB games/tasks. Bed mobility with supervision. Pt engaged in painting task using table top while seated EOB, to focus on activity tolerance, sequencing skills, with pt able to alternate attention between painting and conversation with therapist. Pt often missing certain areas on the horse being painted presumably from distraction in conversation, with cues needed to attend to areas and increased time needed for task as pt with lots of personal questions, however pt very happy with task and pleasant throughout. Able to complete face washing and hand washing at end of task with set up A. Began setting up table top puzzle to complete with pt, with pt able to orient all pieces face up, with supervision, and orient them with "end pieces" in one section vs middle pieces. Cues needed to attend to task, with pt more interested in conversation vs task. Pt declining any foods when offered. Session ended with pt in sidelying, with enclosure bed closed/fastened, call bell accessible and all other needs in reach at end of  session.   Therapy Documentation Precautions:  Precautions Precautions: Fall, Back Precaution Comments: no brace needed Required Braces or Orthoses: Sling Restrictions Weight Bearing Restrictions: No RUE Weight Bearing: Non weight bearing LLE Weight Bearing: Weight bearing as tolerated Other Position/Activity Restrictions: R clavicle fracture - NWB in sling x2-3 weeks  Pain: No c/o pain Agitated Behavior Scale: TBI Observation Details Observation Environment: patients room Start of observation period - Date: 04/28/21 Start of observation period - Time: 1350 End of observation period - Date: 04/28/21 End of observation period - Time: 1445 Agitated Behavior Scale (DO NOT LEAVE BLANKS) Short attention span, easy distractibility, inability to concentrate: Present to a slight degree Impulsive, impatient, low tolerance for pain or frustration: Absent Uncooperative, resistant to care, demanding: Present to a slight degree Violent and/or threatening violence toward people or property: Absent Explosive and/or unpredictable anger: Absent Rocking, rubbing, moaning, or other self-stimulating behavior: Absent Pulling at tubes, restraints, etc.: Absent Wandering from treatment areas: Absent Restlessness, pacing, excessive movement: Absent Repetitive behaviors, motor, and/or verbal: Present to a slight degree Rapid, loud, or excessive talking: Absent Sudden changes of mood: Absent Easily initiated or excessive crying and/or laughter: Absent Self-abusiveness, physical and/or verbal: Absent Agitated behavior scale total score: 17    Therapy/Group: Individual Therapy  Christ Fullenwider E Garon Melander 04/28/2021, 7:58 AM

## 2021-04-28 NOTE — Progress Notes (Signed)
Speech Language Pathology TBI Note  Patient Details  Name: Sherry Montes MRN: [id number] Date of Birth: 2001-02-13  Today's Date: 04/28/2021 SLP Individual Time: 0730-0825 SLP Individual Time Calculation (min): 55 min  Short Term Goals: Week 2: SLP Short Term Goal 1 (Week 2): Patient will verbally respond to open ended questions in 25% of opportunities with Max A mutlimodal cues. SLP Short Term Goal 2 (Week 2): Patient will initiate functional tasks in 25% of opportunities with Max A multimodal cues. SLP Short Term Goal 3 (Week 2): Patient will demonstrate sustained attention to a basic task for 3 minutes with Max A multimodal cues. SLP Short Term Goal 4 (Week 2): Patient will utilize external aids for orientation to place, time, and situation with Max A multimodal cues.  Skilled Therapeutic Interventions: Skilled treatment session focused on cognitive goals. Upon arrival, patient was asleep in the enclosure bed but was easily awakened. Patient with mildly inappropriate verbalizations with this clinician that were easily redirected. Patient transferred to the wheelchair and allowed for blood work to be completed. Patient required Max verbal cues and encouragement for sustained attention and initiation with self-feeding of her breakfast tray. Patient perseverative on calling her uncle and eventually requested to use the bathroom in order to remove herself from having to eat. Patient sat on commode and would not attempt to remove her pants. Therefore, patient was transferred back to the SLP office where she ate 5 slices of apple. Patient called her uncle X 3 without an answer but continued to perseverate on calling him. Patient transferred back to the enclosure bed and left with it secure and all needs within reach. Continue with current plan of care.      Pain No/Denies Pain   Agitated Behavior Scale: TBI Observation Details Observation Environment: patient's room Start of  observation period - Date: 04/28/21 Start of observation period - Time: 0730 End of observation period - Date: 04/28/21 End of observation period - Time: 0825 Agitated Behavior Scale (DO NOT LEAVE BLANKS) Short attention span, easy distractibility, inability to concentrate: Present to a slight degree Impulsive, impatient, low tolerance for pain or frustration: Absent Uncooperative, resistant to care, demanding: Present to a slight degree Violent and/or threatening violence toward people or property: Absent Explosive and/or unpredictable anger: Absent Rocking, rubbing, moaning, or other self-stimulating behavior: Absent Pulling at tubes, restraints, etc.: Absent Wandering from treatment areas: Absent Restlessness, pacing, excessive movement: Absent Repetitive behaviors, motor, and/or verbal: Present to a slight degree Rapid, loud, or excessive talking: Absent Sudden changes of mood: Absent Easily initiated or excessive crying and/or laughter: Absent Self-abusiveness, physical and/or verbal: Absent Agitated behavior scale total score: 17  Therapy/Group: Individual Therapy  Antiono Ettinger 04/28/2021, 2:24 PM

## 2021-04-29 DIAGNOSIS — S22080S Wedge compression fracture of T11-T12 vertebra, sequela: Secondary | ICD-10-CM | POA: Diagnosis not present

## 2021-04-29 DIAGNOSIS — G479 Sleep disorder, unspecified: Secondary | ICD-10-CM | POA: Diagnosis not present

## 2021-04-29 DIAGNOSIS — S069X3S Unspecified intracranial injury with loss of consciousness of 1 hour to 5 hours 59 minutes, sequela: Secondary | ICD-10-CM | POA: Diagnosis not present

## 2021-04-29 DIAGNOSIS — S82202S Unspecified fracture of shaft of left tibia, sequela: Secondary | ICD-10-CM | POA: Diagnosis not present
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
## 2021-04-30 ENCOUNTER — Encounter (HOSPITAL_COMMUNITY): Payer: Self-pay | Admitting: Orthopedic Surgery

## 2021-04-30 DIAGNOSIS — S069X2S Unspecified intracranial injury with loss of consciousness of 31 minutes to 59 minutes, sequela: Secondary | ICD-10-CM

## 2021-04-30 DIAGNOSIS — D62 Acute posthemorrhagic anemia: Secondary | ICD-10-CM | POA: Diagnosis not present

## 2021-04-30 DIAGNOSIS — R339 Retention of urine, unspecified: Secondary | ICD-10-CM | POA: Diagnosis not present

## 2021-04-30 MED ORDER — METHYLPHENIDATE HCL 5 MG PO TABS: 2.5000 mg | ORAL_TABLET | Freq: Two times a day (BID) | ORAL | Status: DC

## 2021-04-30 MED ORDER — METHYLPHENIDATE HCL 5 MG PO TABS
5.0000 mg | ORAL_TABLET | Freq: Every day | ORAL | Status: DC
  Administered 2021-05-01 – 2021-05-09 (×9): 5 mg via ORAL
  Filled 2021-04-30 (×9): qty 1

## 2021-04-30 MED ORDER — MELATONIN 3 MG PO TABS
3.0000 mg | ORAL_TABLET | Freq: Every day | ORAL | Status: DC
  Administered 2021-04-30 – 2021-05-08 (×9): 3 mg via ORAL
  Filled 2021-04-30 (×8): qty 1
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
## 2021-04-30 NOTE — Progress Notes (Signed)
Physical Therapy TBI Note  Patient Details  Name: Sherry Montes MRN: [id number] Date of Birth: 11/21/2000  Today's Date: 04/30/2021 PT Individual Time: 1106-1205 PT Individual Time Calculation (min): 59 min   Short Term Goals: Week 1:  PT Short Term Goal 1 (Week 1): Patient will participate in at least 75% of her scheduled therapy PT Short Term Goal 1 - Progress (Week 1): Met PT Short Term Goal 2 (Week 1): Patient will transfer bed <> wc with LRAD and ModA x1 PT Short Term Goal 2 - Progress (Week 1): Met PT Short Term Goal 3 (Week 1): Patient will ambulate >5 ft with LRAD and +2 assist as needed PT Short Term Goal 3 - Progress (Week 1): Met Week 2:  PT Short Term Goal 1 (Week 2): Pt will perform bed to chair transfer with CGA. PT Short Term Goal 2 (Week 2): Pt will ambulate x150' with CGA. PT Short Term Goal 3 (Week 2): Pt will complete BERG balance test.  Skilled Therapeutic Interventions/Progress Updates:   Patient in open enclosure bed with her uncle in the room upon PT arrival. Patient alert and agreeable to PT session. Patient denied pain during session.  Patient presents with improved orientation and awareness with no inappropriate behaviors throughout session. Demonstrates verbose and perseverative language throughout session with intermittent language of confusion. Perseverated on present injuries and concerns over removal of L lower extremity stiches. Provided education on behavioral improvements and hyperverbal behaviors related to TBI recovery during session. Patient required mod-max redirection to attend to activities during session due to attention deficits with internal distraction.   Therapeutic Activity: Bed Mobility: Patient performed supine to sit independently.  Transfers: Patient performed sit to/from stand x5 with HHA or L crutch with min A. Provided verbal cues for maintaining R upper extremity NWB.  Gait Training:  Patient ambulated 46 feet using L HHA,  87 feet using L axillary crutch, 87 feet and 25 feet using L forearm crutch, and 21 feet without AD with min A consistently with each device. Difficulty with use of crutches, axillary>forearm crutch, due to decreased coordination and recall of sequencing, mild improvement with repetition. Ambulated with step-to gait pattern leading with L, antalgic gait on L , forward trunk flexion, and downward head gaze. Provided verbal cues for erect posture, sequencing and attention to task.  Patient seated in w/c with her uncle in the room at end of session with breaks locked, seat belt alarm set, and all needs within reach.   Therapy Documentation Precautions:  Precautions Precautions: Fall, Back Precaution Comments: no brace needed Required Braces or Orthoses: Sling Restrictions Weight Bearing Restrictions: No RUE Weight Bearing: Touch down weight bearing LLE Weight Bearing: Weight bearing as tolerated Other Position/Activity Restrictions: R clavicle fracture - NWB in sling x2-3 weeks  Agitated Behavior Scale: TBI Observation Details Observation Environment: CIR Start of observation period - Date: 04/30/21 Start of observation period - Time: 1106 End of observation period - Date: 04/30/21 End of observation period - Time: 1205 Agitated Behavior Scale (DO NOT LEAVE BLANKS) Short attention span, easy distractibility, inability to concentrate: Present to a slight degree Impulsive, impatient, low tolerance for pain or frustration: Absent Uncooperative, resistant to care, demanding: Absent Violent and/or threatening violence toward people or property: Absent Explosive and/or unpredictable anger: Absent Rocking, rubbing, moaning, or other self-stimulating behavior: Absent Pulling at tubes, restraints, etc.: Absent Wandering from treatment areas: Absent Restlessness, pacing, excessive movement: Absent Repetitive behaviors, motor, and/or verbal: Present to a slight degree Rapid,  loud, or excessive  talking: Present to a moderate degree Sudden changes of mood: Absent Easily initiated or excessive crying and/or laughter: Absent Self-abusiveness, physical and/or verbal: Absent Agitated behavior scale total score: 18    Therapy/Group: Individual Therapy  Anaiz Qazi L Sidrah Harden PT, DPT  04/30/2021, 7:39 PM
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
## 2021-05-01 DIAGNOSIS — G479 Sleep disorder, unspecified: Secondary | ICD-10-CM | POA: Diagnosis not present

## 2021-05-01 DIAGNOSIS — S069X3S Unspecified intracranial injury with loss of consciousness of 1 hour to 5 hours 59 minutes, sequela: Secondary | ICD-10-CM | POA: Diagnosis not present

## 2021-05-01 DIAGNOSIS — S82202S Unspecified fracture of shaft of left tibia, sequela: Secondary | ICD-10-CM | POA: Diagnosis not present

## 2021-05-01 DIAGNOSIS — S22080S Wedge compression fracture of T11-T12 vertebra, sequela: Secondary | ICD-10-CM | POA: Diagnosis not present

## 2021-05-01 MED ORDER — POLYETHYLENE GLYCOL 3350 17 G PO PACK
17.0000 g | PACK | Freq: Every day | ORAL | Status: DC
  Administered 2021-05-01 – 2021-05-09 (×9): 17 g via ORAL
  Filled 2021-05-01 (×9): qty 1

## 2021-05-01 MED ORDER — SORBITOL 70 % SOLN
30.0000 mL | Freq: Every day | Status: DC | PRN
  Administered 2021-05-01 – 2021-05-03 (×2): 30 mL via ORAL
  Filled 2021-05-01 (×3): qty 30

## 2021-05-01 NOTE — Progress Notes (Signed)
Occupational Therapy TBI Note  Patient Details  Name: Sherry Montes MRN: [id number] Date of Birth: [birth year]  Today's Date: 05/01/2021 OT Individual Time: 1240-1340 OT Individual Time Calculation (min): 60 min   Short Term Goals: Week 2:  OT Short Term Goal 1 (Week 2): Patient will complete toilet transfer with min A of 1 OT Short Term Goal 2 (Week 2): Patient will maintain attention to grooming task for 1 minute with moderate cues OT Short Term Goal 3 (Week 2): Patient will maintain standing balance at the sink with no more than min A in preparation for BADL tasks  Skilled Therapeutic Interventions/Progress Updates:    Pt greeted seated in wc with uncle present, pt eating lunch. Pt with brighter affect and agreeable to shower today. Pt with no inappropriate remarks throughout session. She was not hyperverbose but was conversational at a normal level. Pt reported knowing that she was saying some crazy things and stated "my brain started working over the weekend." Pt with much improved attention throughout session as well. Functional ambulation from wc to bathroom with min/mod HHA. Pt sat on commode, voided bladder, then performed peri-care with supervision. Min HHA to take a few steps over to tub bench in shower. Bathing completed with pt needing cues for initiation and thoroughness to wash body parts. Sensory input from shower, slightly overstimulating, but overall, she tolerated showering very well. Pt able to use B UE's to wash hair and stand to wash buttocks with min A, grab bars, and verbal cues. Dressing tasks completed from wc at the sink with min A for balance when standing to pull up pants. OT assisted with starting to brush out very matted hair. Educated on trying to have family and friends assist with hair anytime they are here. Pt completed stand-pivot back to enclosure bed and left long sitting in the bed with call bell in reach and needs met.   Therapy  Documentation Precautions:  Precautions Precautions: Fall, Back Precaution Comments: no brace needed Required Braces or Orthoses: Sling Restrictions Weight Bearing Restrictions: No RUE Weight Bearing: Touch down weight bearing LLE Weight Bearing: Weight bearing as tolerated Other Position/Activity Restrictions: R clavicle fracture - NWB in sling x2-3 weeks Pain: Pain Assessment Pain Scale: 0-10 Pain Score: 0-No pain Agitated Behavior Scale: TBI Observation Details Observation Environment: CIR Start of observation period - Date: 05/01/21 Start of observation period - Time: 1240 End of observation period - Date: 05/01/21 End of observation period - Time: 1140 Agitated Behavior Scale (DO NOT LEAVE BLANKS) Short attention span, easy distractibility, inability to concentrate: Absent Impulsive, impatient, low tolerance for pain or frustration: Absent Uncooperative, resistant to care, demanding: Absent Violent and/or threatening violence toward people or property: Absent Explosive and/or unpredictable anger: Absent Rocking, rubbing, moaning, or other self-stimulating behavior: Absent Pulling at tubes, restraints, etc.: Absent Wandering from treatment areas: Absent Restlessness, pacing, excessive movement: Absent Repetitive behaviors, motor, and/or verbal: Absent Rapid, loud, or excessive talking: Absent Sudden changes of mood: Absent Easily initiated or excessive crying and/or laughter: Absent Self-abusiveness, physical and/or verbal: Absent Agitated behavior scale total score: 14   Therapy/Group: Individual Therapy  Valma Cava 05/01/2021, 1:51 PM

## 2021-05-01 NOTE — Progress Notes (Signed)
Patient ID: Sherry Montes, female   DOB: 07-20-2000, 20 y.o.   MRN: [id number]  SW met with pt and pt Uncle Pinakin to provide updates from team conference, and d/c date 05/09/21. Pt uncle reports her parents meet with consulate on Thursday and hopefully are able to get a flight. Fam mtg on Friday at 10am, and family able to stay until [place], MSW, [place] Office: [phone] Cell: [phone] Fax: [phone]

## 2021-05-01 NOTE — Progress Notes (Signed)
Speech Language Pathology TBI Note  Patient Details  Name: Sherry Montes MRN: [id number] Date of Birth: 2000-12-11  Today's Date: 05/01/2021 SLP Individual Time: 1100-1130 SLP Individual Time Calculation (min): 30 min  Short Term Goals: Week 2: SLP Short Term Goal 1 (Week 2): Patient will verbally respond to open ended questions in 25% of opportunities with Max A mutlimodal cues. SLP Short Term Goal 2 (Week 2): Patient will initiate functional tasks in 25% of opportunities with Max A multimodal cues. SLP Short Term Goal 3 (Week 2): Patient will demonstrate sustained attention to a basic task for 3 minutes with Max A multimodal cues. SLP Short Term Goal 4 (Week 2): Patient will utilize external aids for orientation to place, time, and situation with Max A multimodal cues.  Skilled Therapeutic Interventions: Skilled ST treatment focused on cognitive goals. Patient was accompanied by her family friend this date who endorsed significant improvement with mental clarity and approximately "90% back to her baseline" with the exception of patient being verbose. This was not observed during today's encounter. Patient was very socially appropriate with responses and interaction toward therapist and friend. Patient was oriented x4 without cues. SLP facilitated novel card task "Blink" with focus on alternating attention, recall, problem solving/verbal reasoning. Patient completed with modified independence and great carry over and recall of task instructions. Patient sustained attention for duration of session with minimal-to-no redirection required. Recommend addressing high level cognitive tasks at next session. Patient was left in wheelchair with family member at bedside, chair alarm activated and immediate needs within reach at end of session. Continue per current plan of care.      Pain Pain Assessment Pain Scale: 0-10 Pain Score: 0-No pain  Agitated Behavior Scale: TBI Observation  Details Observation Environment: cir Start of observation period - Date: 05/01/21 Start of observation period - Time: 1100 End of observation period - Date: 05/01/21 End of observation period - Time: 1130 Agitated Behavior Scale (DO NOT LEAVE BLANKS) Short attention span, easy distractibility, inability to concentrate: Absent Impulsive, impatient, low tolerance for pain or frustration: Absent Uncooperative, resistant to care, demanding: Absent Violent and/or threatening violence toward people or property: Absent Explosive and/or unpredictable anger: Absent Rocking, rubbing, moaning, or other self-stimulating behavior: Absent Pulling at tubes, restraints, etc.: Absent Wandering from treatment areas: Absent Restlessness, pacing, excessive movement: Absent Repetitive behaviors, motor, and/or verbal: Absent Rapid, loud, or excessive talking: Absent Sudden changes of mood: Absent Easily initiated or excessive crying and/or laughter: Absent Self-abusiveness, physical and/or verbal: Absent Agitated behavior scale total score: 14  Therapy/Group: Individual Therapy  Sarthak Rubenstein T [place] Saber 05/01/2021, 11:59 AM

## 2021-05-01 NOTE — Progress Notes (Signed)
Physical Therapy TBI Note  Patient Details  Name: Sherry Montes MRN: [id number] Date of Birth: 10/28/2000  Today's Date: 05/01/2021 PT Individual Time: 0902-0959 PT Individual Time Calculation (min): 57 min   Short Term Goals: Week 2:  PT Short Term Goal 1 (Week 2): Pt will perform bed to chair transfer with CGA. PT Short Term Goal 2 (Week 2): Pt will ambulate x150' with CGA. PT Short Term Goal 3 (Week 2): Pt will complete BERG balance test.  Skilled Therapeutic Interventions/Progress Updates:     Pt received supine in bed and agrees to therapy. Reports some pain in L leg. Number not provided. PT provides rest breaks and mobility to manage pain. Supine to sit with verbal cues on positioning at EOB. Pt performs stand pivot transfer to [facility] with CGA and cues for sequencing. WC transport to gym for time management. Pt performs BERG balance test. Pt scores 32/46, indicating high risk for falls.  Pt trials quad cane in L hand and ambulates x30' with minA and inconsistent use of quad cane. Pt requests to have weight taken so WC transport to dayroom. MinA HHA on L with multimodal cues for step up onto and back from scale to record weight. Pt then ambulates x175' with minA HHA on L with verbal and tactile cues for weight shifting, upright gaze to improve posture and balance, increasing L stride length, and sequencing of transition back to WC. Pt left seated in WC with alarm intact and all needs within reach.  Therapy Documentation Precautions:  Precautions Precautions: Fall, Back Precaution Comments: no brace needed Required Braces or Orthoses: Sling Restrictions Weight Bearing Restrictions: No RUE Weight Bearing: Touch down weight bearing LLE Weight Bearing: Weight bearing as tolerated Other Position/Activity Restrictions: R clavicle fracture - NWB in sling x2-3 weeks Agitated Behavior Scale: TBI       Therapy/Group: Individual Therapy  Beau Fanny, PT, DPT 05/01/2021,  2:56 PM
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
## 2021-05-02 DIAGNOSIS — G479 Sleep disorder, unspecified: Secondary | ICD-10-CM | POA: Diagnosis not present

## 2021-05-02 DIAGNOSIS — S22080S Wedge compression fracture of T11-T12 vertebra, sequela: Secondary | ICD-10-CM | POA: Diagnosis not present

## 2021-05-02 DIAGNOSIS — S069X3S Unspecified intracranial injury with loss of consciousness of 1 hour to 5 hours 59 minutes, sequela: Secondary | ICD-10-CM | POA: Diagnosis not present

## 2021-05-02 DIAGNOSIS — S82202S Unspecified fracture of shaft of left tibia, sequela: Secondary | ICD-10-CM | POA: Diagnosis not present

## 2021-05-02 NOTE — Progress Notes (Signed)
Physical Therapy TBI Note  Patient Details  Name: Sherry Montes MRN: [id number] Date of Birth: November 04, 2000  Today's Date: 05/02/2021 PT Individual Time: 1001-1058 PT Individual Time Calculation (min): 57 min   Short Term Goals: Week 2:  PT Short Term Goal 1 (Week 2): Pt will perform bed to chair transfer with CGA. PT Short Term Goal 2 (Week 2): Pt will ambulate x150' with CGA. PT Short Term Goal 3 (Week 2): Pt will complete BERG balance test.  Skilled Therapeutic Interventions/Progress Updates:     Pt received seated in WC and agrees to therapy. Reports some pain in L leg. Number not provided. Pt provides rest breaks and mobility to manage pain. WC transport to gym for time management. Pt participates in BITS system activity, initially with memory activity and then path finding, challenging cognition as well as dynamic standing balance. Pt able to complete both cognitive and coordination challenges without difficulty. WC transport to dayroom. Pt performs stand step transfer to Nustep with minA and cues for initiation, sequencing, and positioning. Pt performs Nustep for strength and endurance training, as well as reciprocal coordination. PT provides tactile cueing for optimal body mechanics during activity. Pt completes x15:00 at workload of 5 with average steps per minute ~40. Pt performs sit to stand with minA and ambulates x175' with L HHA, with mina tactile cueing at trunk to promote upright posture and as well as lateral weight shifting, and symmetrical reciprocal gait pattern. WC transport back to room. Left seated with alarm intact and all needs within reach.  Therapy Documentation Precautions:  Precautions Precautions: Fall, Back Precaution Comments: no brace needed Required Braces or Orthoses: Sling Restrictions Weight Bearing Restrictions: No RUE Weight Bearing: Touch down weight bearing LLE Weight Bearing: Weight bearing as tolerated Other Position/Activity  Restrictions: R clavicle fracture - NWB in sling x2-3 weeks Agitated Behavior Scale: TBI Observation Details Observation Environment: CIR Start of observation period - Date: 05/02/21 Start of observation period - Time: 1000 End of observation period - Date: 05/02/21 End of observation period - Time: 1100 Agitated Behavior Scale (DO NOT LEAVE BLANKS) Short attention span, easy distractibility, inability to concentrate: Present to a slight degree Impulsive, impatient, low tolerance for pain or frustration: Absent Uncooperative, resistant to care, demanding: Absent Violent and/or threatening violence toward people or property: Absent Explosive and/or unpredictable anger: Absent Rocking, rubbing, moaning, or other self-stimulating behavior: Absent Pulling at tubes, restraints, etc.: Absent Wandering from treatment areas: Absent Restlessness, pacing, excessive movement: Absent Repetitive behaviors, motor, and/or verbal: Absent Rapid, loud, or excessive talking: Present to a slight degree Sudden changes of mood: Absent Easily initiated or excessive crying and/or laughter: Absent Self-abusiveness, physical and/or verbal: Absent Agitated behavior scale total score: 16    Therapy/Group: Individual Therapy  Beau Fanny, PT, DPT 05/02/2021, 2:43 PM

## 2021-05-02 NOTE — Progress Notes (Signed)
Speech Language Pathology TBI Note  Patient Details  Name: Sherry Montes MRN: [id number] Date of Birth: 2000-12-14  Today's Date: 05/02/2021 SLP Individual Time: 1415-1457 SLP Individual Time Calculation (min): 42 min  Short Term Goals: Week 2: SLP Short Term Goal 1 (Week 2): Patient will verbally respond to open ended questions in 25% of opportunities with Max A mutlimodal cues. SLP Short Term Goal 2 (Week 2): Patient will initiate functional tasks in 25% of opportunities with Max A multimodal cues. SLP Short Term Goal 3 (Week 2): Patient will demonstrate sustained attention to a basic task for 3 minutes with Max A multimodal cues. SLP Short Term Goal 4 (Week 2): Patient will utilize external aids for orientation to place, time, and situation with Max A multimodal cues.  Skilled Therapeutic Interventions: Skilled ST treatment focused on cognitive goals. Patient appeared in good spirits and accompanied by friend. Pt appropriate throughout session. Patient was oriented to person/place/time/situation independent of cues. Patient demonstrated sustained attention to tasks with sup A verbal redirection. She exhibited reduced ability to alternate attention between topics and required 2-3 instances of verbal clarification and redirection, however cannot rule out possible result of language differences. Patient demonstrated anticipatory awareness by verbalizing current physical and cognitive limitations and anticipating needs from family at discharge (walking, balance, bathing, slowing down to process information). Pt requested to use bathroom prior to end of session. Pt was continent of bladder and performed peri-care with supervision. Pt expressed she had the urge for a bowel movement but unsuccessful. Requested to place brief "just in case." SLP reinforced call bell for assistance. Returned patient to enclosure bed at end of session with call bell in reach and needs met. Friend at bedside.  Continue per current plan of care.      Pain Pain Assessment Pain Scale: 0-10 Pain Score: 0-No pain  Agitated Behavior Scale: TBI Observation Details Observation Environment: CIR Start of observation period - Date: 05/02/21 Start of observation period - Time: 1415 End of observation period - Date: 05/02/21 End of observation period - Time: 1457 Agitated Behavior Scale (DO NOT LEAVE BLANKS) Short attention span, easy distractibility, inability to concentrate: Present to a slight degree Impulsive, impatient, low tolerance for pain or frustration: Absent Uncooperative, resistant to care, demanding: Absent Violent and/or threatening violence toward people or property: Absent Explosive and/or unpredictable anger: Absent Rocking, rubbing, moaning, or other self-stimulating behavior: Absent Pulling at tubes, restraints, etc.: Absent Wandering from treatment areas: Absent Restlessness, pacing, excessive movement: Absent Repetitive behaviors, motor, and/or verbal: Absent Rapid, loud, or excessive talking: Present to a slight degree Sudden changes of mood: Absent Easily initiated or excessive crying and/or laughter: Absent Self-abusiveness, physical and/or verbal: Absent Agitated behavior scale total score: 16  Therapy/Group: Individual Therapy  Patty Sermons 05/02/2021, 4:54 PM
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
## 2021-05-02 NOTE — Progress Notes (Signed)
Patient has remained alert and oriented x4 for the past 3 nights. Continues in enclosure bed but demonstrates appropriate behavior with staff, does not attempt to get up alone and uses the call bell appropriately. Still no BM since 12/22. Sorbitol given with no result and patient refuses dulcolax suppository. Patient states she has chronic constipation prior to hospitalization. Continues to decrease appetite. Patient currently in bed with call bell within reach. Will continue with plan of care.

## 2021-05-02 NOTE — Progress Notes (Signed)
Occupational Therapy Session Note  Patient Details  Name: Sherry Montes MRN: [id number] Date of Birth: Sep 14, 2000  Today's Date: 05/02/2021 OT Individual Time: 1300-1359 OT Individual Time Calculation (min): 59 min    Short Term Goals: Week 2:  OT Short Term Goal 1 (Week 2): Patient will complete toilet transfer with min A of 1 OT Short Term Goal 2 (Week 2): Patient will maintain attention to grooming task for 1 minute with moderate cues OT Short Term Goal 3 (Week 2): Patient will maintain standing balance at the sink with no more than min A in preparation for BADL tasks  Skilled Therapeutic Interventions/Progress Updates:  Patient met seated in wc in agreement with OT treatment session. 0/10 pain reported at rest and with activity. Total A for wc transport from hospital room <> rehab gym. RN with concern for patient's elevated HR (114-130's). HR monitored throughout session with HR 127-134 with light activity. Functional mobility several bouts x30-59f w/ HHA. Focus then shifted to dynamic standing balance first without blue foam then on blue foam while reaching outside of BOS during horseshoe game. Extensive rest breaks provided between all activities secondary to elevated HR. Patient hopeful to be downgraded from enclosure bed to standard hospital bed given improved cognitive function. Session concluded with patient lying in enclosure bed with call bell within reach and all needs met. Aunt present at bedside.    Therapy Documentation Precautions:  Precautions Precautions: Fall, Back Precaution Comments: no brace needed Required Braces or Orthoses: Sling Restrictions Weight Bearing Restrictions: No RUE Weight Bearing: Touch down weight bearing LLE Weight Bearing: Weight bearing as tolerated Other Position/Activity Restrictions: R clavicle fracture - NWB in sling x2-3 weeks General:    Therapy/Group: Individual Therapy  [name] 05/02/2021, 12:09 PM

## 2021-05-03 DIAGNOSIS — S82202S Unspecified fracture of shaft of left tibia, sequela: Secondary | ICD-10-CM | POA: Diagnosis not present

## 2021-05-03 DIAGNOSIS — S22080S Wedge compression fracture of T11-T12 vertebra, sequela: Secondary | ICD-10-CM | POA: Diagnosis not present

## 2021-05-03 DIAGNOSIS — G479 Sleep disorder, unspecified: Secondary | ICD-10-CM | POA: Diagnosis not present

## 2021-05-03 DIAGNOSIS — S069X3S Unspecified intracranial injury with loss of consciousness of 1 hour to 5 hours 59 minutes, sequela: Secondary | ICD-10-CM | POA: Diagnosis not present

## 2021-05-03 NOTE — Progress Notes (Signed)
Physical Therapy TBI Note  Patient Details  Name: Sherry Montes MRN: [id number] Date of Birth: 2000/12/19  Today's Date: 05/03/2021 PT Individual Time: 1310-1400 PT Individual Time Calculation (min): 50 min   Short Term Goals: Week 2:  PT Short Term Goal 1 (Week 2): Pt will perform bed to chair transfer with CGA. PT Short Term Goal 2 (Week 2): Pt will ambulate x150' with CGA. PT Short Term Goal 3 (Week 2): Pt will complete BERG balance test.  Skilled Therapeutic Interventions/Progress Updates:     Patient in the bathroom with RN in the room upon PT arrival. Patient missed 10 min of skilled PT due to toileting, RN made aware. Will attempt to make-up missed time as able.  Patient alert and agreeable to PT session. Patient reported 2-3/10 L lower extremity pain during session, RN made aware. PT provided repositioning, rest breaks, and distraction as pain interventions throughout session. Patient was unable to have a BM during toileting, stated that it has been 3 days and stated her stomach feels really "heavy." RN aware and planning suppository after therapies completed today.   Therapeutic Activity: Transfers: Patient performed sit to/from stand x7 with min A progressing to supervision. Provided verbal cues for forward and equal weight shift as tolerated with L lower extremity 50% weight bearing.  Gait Training:  Patient ambulated 15 feet in the room, 208 feet x2, and 148 feet with HHA initially progressing to no AD with min/mod A progressing to min A-CGA. Ambulated with antalgic gait on L, B lateral hip instability L>R, increased L hip internal rotation, increased L hip and knee flexion in stance, and increased L lateral trunk lean. Provided verbal cues for erect posture, increased arm swing for balance, L side elongation in stance, and equal reciprocal step length. Patient ascended/descended 16x6"steps and 8x6" steps using 1 rail with mod A progressing to min A. Performed reciprocal  gait pattern while ascending and step-to gait pattern while descending leading with L. Provided cues for technique and sequencing.   Neuromuscular Re-ed: Patient performed the following dynamic standing balance and dynamic gait activities for improved motor and postural control with functional mobility: -side-stepping R/L x15 ft with min A -backwards walking x15 ft with min A-CGA -sit to stand without upper extremity support with mirror for visual feedback for equal weight bearing 2x10 with supervision  Patient sitting EOB with her uncle at bedside at end of session with breaks locked, bed alarm set, and all needs within reach.   Therapy Documentation Precautions:  Precautions Precautions: Fall, Back Precaution Comments: no brace needed Required Braces or Orthoses: Sling Restrictions Weight Bearing Restrictions: No RUE Weight Bearing: Weight bearing as tolerated LLE Weight Bearing: Weight bearing as tolerated Other Position/Activity Restrictions: R clavicle fracture - NWB in sling x2-3 weeks Agitated Behavior Scale: TBI Observation Details Observation Environment: CIR Start of observation period - Date: 05/03/21 Start of observation period - Time: 1310 End of observation period - Date: 05/03/21 End of observation period - Time: 1400 Agitated Behavior Scale (DO NOT LEAVE BLANKS) Short attention span, easy distractibility, inability to concentrate: Present to a slight degree Impulsive, impatient, low tolerance for pain or frustration: Absent Uncooperative, resistant to care, demanding: Absent Violent and/or threatening violence toward people or property: Absent Explosive and/or unpredictable anger: Absent Rocking, rubbing, moaning, or other self-stimulating behavior: Absent Pulling at tubes, restraints, etc.: Absent Wandering from treatment areas: Absent Restlessness, pacing, excessive movement: Absent Repetitive behaviors, motor, and/or verbal: Absent Rapid, loud, or excessive  talking: Absent Sudden  changes of mood: Absent Easily initiated or excessive crying and/or laughter: Absent Self-abusiveness, physical and/or verbal: Absent Agitated behavior scale total score: 15    Therapy/Group: Individual Therapy  Burnette Valenti L Clerence Gubser PT, DPT  05/03/2021, 4:36 PM

## 2021-05-03 NOTE — Progress Notes (Signed)
Occupational Therapy TBI Note  Patient Details  Name: Sherry Montes MRN: [id number] Date of Birth: May 30, 2000  Today's Date: 05/03/2021 OT Individual Time: 1105-1205 OT Individual Time Calculation (min): 60 min    Short Term Goals: Week 2:  OT Short Term Goal 1 (Week 2): Patient will complete toilet transfer with min A of 1 OT Short Term Goal 2 (Week 2): Patient will maintain attention to grooming task for 1 minute with moderate cues OT Short Term Goal 3 (Week 2): Patient will maintain standing balance at the sink with no more than min A in preparation for BADL tasks  Skilled Therapeutic Interventions/Progress Updates:    Pt greeted sitting in recliner with uncle present and agreeable to OT treatment session. Pt wanted to shower today. Pt very appropriate throughout session. Pt ambulated with min HHA to the shower. Pt needed verbal cues for safety to sit and doff pants from bench. Bathing completed with overall CGA when standing to wash buttocks. Dressing tasks completed sit<>stand from wc at the sink with CGA when standing to pull up pants. Pt brought to dayroom and worked on problem solving and visiospatial skills with graded peg board task. Pt needed min cues to locate error. OT provided cues for how to count open holes on board to find the correct position. Pt returned to room as nurse started changing out enclosure bed to regular bed. Pt left with alarm belt on, call bell in reach, and needs met.   Therapy Documentation Precautions:  Precautions Precautions: Fall, Back Precaution Comments: no brace needed Required Braces or Orthoses: Sling Restrictions Weight Bearing Restrictions: No RUE Weight Bearing: Weight bearing as tolerated LLE Weight Bearing: Weight bearing as tolerated Other Position/Activity Restrictions: R clavicle fracture - NWB in sling x2-3 weeks Pain: Pain Assessment Pain Scale: 0-10 Pain Score: 0-No pain Agitated Behavior Scale: TBI Observation  Details Observation Environment: CIR Start of observation period - Date: 05/03/21 Start of observation period - Time: 1105 End of observation period - Date: 05/03/21 End of observation period - Time: 1205 Agitated Behavior Scale (DO NOT LEAVE BLANKS) Short attention span, easy distractibility, inability to concentrate: Absent Impulsive, impatient, low tolerance for pain or frustration: Absent Uncooperative, resistant to care, demanding: Absent Violent and/or threatening violence toward people or property: Absent Explosive and/or unpredictable anger: Absent Rocking, rubbing, moaning, or other self-stimulating behavior: Absent Pulling at tubes, restraints, etc.: Absent Wandering from treatment areas: Absent Restlessness, pacing, excessive movement: Absent Repetitive behaviors, motor, and/or verbal: Absent Rapid, loud, or excessive talking: Absent Sudden changes of mood: Absent Easily initiated or excessive crying and/or laughter: Absent Self-abusiveness, physical and/or verbal: Absent Agitated behavior scale total score: 14   Therapy/Group: Individual Therapy  Valma Cava 05/03/2021, 11:55 AM

## 2021-05-03 NOTE — Progress Notes (Addendum)
Speech Language Pathology Weekly Progress and Session Note  Patient Details  Name: Sherry Montes MRN: [id number] Date of Birth: 01/07/2001  Beginning of progress report period: April 27, 2021 End of progress report period: May 04, 2021  Today's Date: 05/04/2021 SLP Individual Time: 1115-1200 SLP Individual Time Calculation (min): 45 min  Short Term Goals: Week 2: SLP Short Term Goal 1 (Week 2): Patient will verbally respond to open ended questions in 25% of opportunities with Max A mutlimodal cues. SLP Short Term Goal 1 - Progress (Week 2): Met SLP Short Term Goal 2 (Week 2): Patient will initiate functional tasks in 25% of opportunities with Max A multimodal cues. SLP Short Term Goal 2 - Progress (Week 2): Met SLP Short Term Goal 3 (Week 2): Patient will demonstrate sustained attention to a basic task for 3 minutes with Max A multimodal cues. SLP Short Term Goal 3 - Progress (Week 2): Met SLP Short Term Goal 4 (Week 2): Patient will utilize external aids for orientation to place, time, and situation with Max A multimodal cues. SLP Short Term Goal 4 - Progress (Week 2): Met  New Short Term Goals: Week 3: SLP Short Term Goal 1 (Week 3): STG=LTG due to ELOS  Weekly Progress Updates: Patient has made excellent gains and has met 4 of 4 STGs this reporting period. Patient currently demonstrates behaviors consistent with [place]. Patient demonstrates significantly improved alertness, participation, and appropriate behaviors at this time. She is eating/drinking/taking medications with intermittent supervision and without resistance. Patient is currently modified independent for orientation, and requiring sup A in regards to sustained and alternating attention, functional recall, complex problem solving, and emergent awareness to complete functional and complex tasks accurately and safely. Patient and family education is ongoing. Patient would benefit from continued  skilled SLP intervention to maximize cognitive functioning and overall functional independence prior to discharge. Pt is expected to continue to improve with cognition with ongoing skilled SLP intervention.     Intensity: Minumum of 1-2 x/day, 30 to 90 minutes Frequency: 3 to 5 out of 7 days Duration/Length of Stay: 1/4 Treatment/Interventions: Cognitive remediation/compensation;Internal/external aids;Speech/Language facilitation;Environmental controls;Cueing hierarchy;Functional tasks;Patient/family education;Therapeutic Activities  Daily Session Skilled Therapeutic Interventions: Skilled ST treatment focused on cognitive goals. SLP re-administered the [facility] Mental Status Examination (SLUMS) and patient scored 28/30 points with a score of 27 or above considered within normal range. Scores representative of significant improvement as compared to 18/30 obtained on 04/27/21. Patient excelled in all areas. She benefited from additional support for processing, working memory, and alternating attention. SLP reviewed results with patient and family. All were very pleased with pt's progress. SLP facilitated high level cognitive tasks. Patient completed alternating attention with mod I, complex deductive reasoning with sup A verbal cues, planning/organizing daily schedule with sup A verbal cues, and identified and corrected errors with sup A verbal cues. Patient was left in recliner with alarm activated and immediate needs within reach at end of session. Continue per current plan of care.      General    Pain  No pain  Therapy/Group: Individual Therapy  Patty Sermons 05/03/2021, 5:35 PM

## 2021-05-03 NOTE — Progress Notes (Signed)
This nurse did a trial run with enclosure bed and left the enclosure bed unzipped through out the night. Patient stayed in bed throughout the night and did not attempt to get up without assistance. Patient continues alert and oriented x4. Patient continues to behave appropriately. Patient in bed with call bell within reach, will continue with plan of care.

## 2021-05-03 NOTE — Progress Notes (Signed)
Physical Therapy TBI Note  Patient Details  Name: Sherry Montes MRN: [id number] Date of Birth: [birth year]  Today's Date: 05/03/2021 PT Individual Time: 7169-6789 PT Individual Time Calculation (min): 26 min   Short Term Goals: Week 2:  PT Short Term Goal 1 (Week 2): Pt will perform bed to chair transfer with CGA. PT Short Term Goal 2 (Week 2): Pt will ambulate x150' with CGA. PT Short Term Goal 3 (Week 2): Pt will complete BERG balance test.  Skilled Therapeutic Interventions/Progress Updates:     Pt received seated in recliner and agrees to therapy. No complaint of pain. Pt educates on use of RW to decrease strain through L lower extremity and pt agreeable to trial. Sit to stand to RW with cues for initiation. Pt ambulates bouts of 150', 175', and 175' with seated rest breaks. PT provides CGA and cues to maintain upright gaze to improve posture and balance, increasing stride length to decrease risk for falls, and sequencing when transitioning to seated position. Pt left seated in recliner with alarm intact and all needs within reach.  Therapy Documentation Precautions:  Precautions Precautions: Fall, Back Precaution Comments: no brace needed Required Braces or Orthoses: Sling Restrictions Weight Bearing Restrictions: No RUE Weight Bearing: Weight bearing as tolerated LLE Weight Bearing: Weight bearing as tolerated Other Position/Activity Restrictions: R clavicle fracture - NWB in sling x2-3 weeks Agitated Behavior Scale: TBI Observation Details Observation Environment: CIR Start of observation period - Date: 05/03/21 Start of observation period - Time: 0930 End of observation period - Date: 05/03/21 End of observation period - Time: 1000 Agitated Behavior Scale (DO NOT LEAVE BLANKS) Short attention span, easy distractibility, inability to concentrate: Present to a slight degree Impulsive, impatient, low tolerance for pain or frustration: Absent Uncooperative,  resistant to care, demanding: Absent Violent and/or threatening violence toward people or property: Absent Explosive and/or unpredictable anger: Absent Rocking, rubbing, moaning, or other self-stimulating behavior: Absent Pulling at tubes, restraints, etc.: Absent Wandering from treatment areas: Absent Restlessness, pacing, excessive movement: Absent Repetitive behaviors, motor, and/or verbal: Absent Rapid, loud, or excessive talking: Present to a slight degree Sudden changes of mood: Absent Easily initiated or excessive crying and/or laughter: Absent Self-abusiveness, physical and/or verbal: Absent Agitated behavior scale total score: 16   Therapy/Group: Individual Therapy  Beau Fanny, PT, DPT 05/03/2021, 4:15 PM
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
## 2021-05-04 DIAGNOSIS — S069X3S Unspecified intracranial injury with loss of consciousness of 1 hour to 5 hours 59 minutes, sequela: Secondary | ICD-10-CM | POA: Diagnosis not present

## 2021-05-04 DIAGNOSIS — S22080S Wedge compression fracture of T11-T12 vertebra, sequela: Secondary | ICD-10-CM | POA: Diagnosis not present

## 2021-05-04 DIAGNOSIS — S82202S Unspecified fracture of shaft of left tibia, sequela: Secondary | ICD-10-CM | POA: Diagnosis not present

## 2021-05-04 DIAGNOSIS — G479 Sleep disorder, unspecified: Secondary | ICD-10-CM | POA: Diagnosis not present

## 2021-05-04 NOTE — Progress Notes (Signed)
Patient ID: Sherry Montes, female   DOB: 2000-12-23, 20 y.o.   MRN: [id number]  SW ordered RW and 3in1 BSC with [employer] via parachute.   SW faxed outpatient PT/OT/SLP referral to [facility] (p:[phone]/f:[phone]).  Cecile Sheerer, MSW, LCSWA Office: [phone] Cell: [phone] Fax: [phone]

## 2021-05-04 NOTE — Progress Notes (Signed)
Occupational Therapy Weekly Progress Note  Patient Details  Name: Janiesha Diehl MRN: [id number] Date of Birth: 2000-06-19  Beginning of progress report period: April 21, 2021 End of progress report period: May 04, 2021  Today's Date: 05/04/2021 OT Individual Time: 6270-3500 OT Individual Time Calculation (min): 43 min    Patient has met 3 of 3 short term goals.  Patient has made EXCELLENT progress towards OT goals this week. Patient has emerged into a Rancho 6/7 and is much more appropriate, oriented, and participating well in therapy. Patient is at an overall CGA level for functional BADL tasks. She continues to demonstrate some deficits in attention and safety awareness impacting independence with functional tasks, but is making great progress towards supervision level goals. Continue current POC.   Patient continues to demonstrate the following deficits: muscle weakness, decreased initiation, decreased attention, decreased awareness, decreased problem solving, decreased safety awareness, and demonstrates behaviors consistent with Rancho Level VII, and decreased standing balance and decreased balance strategies and therefore will continue to benefit from skilled OT intervention to enhance overall performance with BADL.  Patient progressing toward long term goals..  Continue plan of care.  OT Short Term Goals Week 2:  OT Short Term Goal 1 (Week 2): Patient will complete toilet transfer with min A of 1 OT Short Term Goal 1 - Progress (Week 2): Met OT Short Term Goal 2 (Week 2): Patient will maintain attention to grooming task for 1 minute with moderate cues OT Short Term Goal 2 - Progress (Week 2): Met OT Short Term Goal 3 (Week 2): Patient will maintain standing balance at the sink with no more than min A in preparation for BADL tasks OT Short Term Goal 3 - Progress (Week 2): Met Week 3:  OT Short Term Goal 1 (Week 3): LTG=STG 2/2 ELOS  Skilled Therapeutic  Interventions/Progress Updates:    Pt greeted seated in recliner with family present for family education. Discussed BI education in regards to BADL activities, safety awareness, and overstimulation. Pt then brought to therapy apartment where we practiced walk-in shower transfer in simulated home environment. Pt needed CGA and verbal cues for safety with RW when stepping over small shower ledge. Educated on use of 3-in-1 BSC in shower as shower seat as well as over top of commode. Pt then ambulated to dayroom with Aunt providing CGA with RW. Worked on pathfinding and problem solving to recall how to get back to [place]. Educated on ROM exercises using wash cloth on table as well as against gravity. Pt then ambulated back to room and practiced toilet transfer with aunt and CGA with continues min cues for RW position and safety. Pt left seated in recliner at end of session with alarm belt on, Aunts present, and needs met.     Therapy Documentation Precautions:  Precautions Precautions: Fall, Back Precaution Comments: no brace needed Required Braces or Orthoses: Sling Restrictions Weight Bearing Restrictions: No RUE Weight Bearing: Weight bearing as tolerated LLE Weight Bearing: Weight bearing as tolerated Other Position/Activity Restrictions: R clavicle fracture - NWB in sling x2-3 weeks Pain: Pain Assessment Pain Scale: 0-10 Pain Score: 0-No pain    05/04/21 1200  Observation Details  Observation Environment CIR  Start of observation period - Date 05/04/21  Start of observation period - Time 1030  End of observation period - Date 05/04/21  End of observation period - Time 1115  Agitated Behavior Scale (DO NOT LEAVE BLANKS)  Short attention span, easy distractibility, inability to concentrate 2  Impulsive, impatient, low tolerance for pain or frustration 1  Uncooperative, resistant to care, demanding 1  Violent and/or threatening violence toward people or property 1  Explosive and/or  unpredictable anger 1  Rocking, rubbing, moaning, or other self-stimulating behavior 1  Pulling at tubes, restraints, etc. 1  Wandering from treatment areas 1  Restlessness, pacing, excessive movement 1  Repetitive behaviors, motor, and/or verbal 1  Rapid, loud, or excessive talking 1  Sudden changes of mood 1  Easily initiated or excessive crying and/or laughter 1  Self-abusiveness, physical and/or verbal 1  Agitated behavior scale total score 15    Therapy/Group: Individual Therapy  Valma Cava 05/04/2021, 8:56 AM
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
## 2021-05-04 NOTE — Progress Notes (Signed)
Pt slept peacefully through the night and was compliant with safety protocols.

## 2021-05-04 NOTE — Progress Notes (Signed)
Physical Therapy Session Note  Patient Details  Name: Sherry Montes MRN: [id number] Date of Birth: [birth year]  Today's Date: 05/04/2021 PT Individual Time: 6269-4854 PT Individual Time Calculation (min): 57 min   Short Term Goals: Week 2:  PT Short Term Goal 1 (Week 2): Pt will perform bed to chair transfer with CGA. PT Short Term Goal 2 (Week 2): Pt will ambulate x150' with CGA. PT Short Term Goal 3 (Week 2): Pt will complete BERG balance test.  Skilled Therapeutic Interventions/Progress Updates:     Pt received seated in recliner and agrees to therapy. Reports pain in L lower leg. Number not provided. Pt provided rest breaks as needed to manage pain. Pt performs sit to stand to RW with verbal cues for hand placement. Pt ambulates x200' to gym with CGA and cues for upright gaze to improve posture and balance. Following seated rest break, pt completes x16 steps with bilateral hand rails and CGA, with cues for step sequencing. Pt then transported outside for gait training over unlevel and varying surfaces. Pt ambulates 3x150' with RW and minA, with consistent verbal and tactile cueing ot maintain upright gaze to improve posture and safety, and utilizing symmetrical reciprocal gait pattern. WC transport back inside. Left supine in bed with alarm intact and all needs within reach.  Therapy Documentation Precautions:  Precautions Precautions: Fall, Back Precaution Comments: no brace needed Required Braces or Orthoses: Sling Restrictions Weight Bearing Restrictions: No RUE Weight Bearing: Weight bearing as tolerated LLE Weight Bearing: Weight bearing as tolerated Other Position/Activity Restrictions: R clavicle fracture - NWB in sling x2-3 weeks   Therapy/Group: Individual Therapy  Beau Fanny, PT, DPT 05/04/2021, 3:57 PM

## 2021-05-05 DIAGNOSIS — S069X2S Unspecified intracranial injury with loss of consciousness of 31 minutes to 59 minutes, sequela: Secondary | ICD-10-CM | POA: Diagnosis not present

## 2021-05-05 DIAGNOSIS — D62 Acute posthemorrhagic anemia: Secondary | ICD-10-CM | POA: Diagnosis not present

## 2021-05-05 DIAGNOSIS — R339 Retention of urine, unspecified: Secondary | ICD-10-CM | POA: Diagnosis not present
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
## 2021-05-05 NOTE — Progress Notes (Signed)
Occupational Therapy TBI Note  Patient Details  Name: Sherry Montes MRN: [id number] Date of Birth: Jun 09, 2000  Today's Date: 05/05/2021 OT Individual Time: 3710-6269 OT Individual Time Calculation (min): 62 min    Short Term Goals: Week 3:  OT Short Term Goal 1 (Week 3): LTG=STG 2/2 ELOS  Skilled Therapeutic Interventions/Progress Updates:  Pt greeted  seated in recliner    agreeable to OT intervention. Session focus on BADL reeducation, functional mobility, and decreasing overall caregiver burden. Pt completed ambualtory shower transfer with Rw and close supervision. Pt completed bathing from shower seat with overall supervision. Pt exited shower with supervision with rw to EOB. Pt retrieved her own clothes from the closet with supervision with pt dressing from EOB independently. Pt completed functional mobility to sink for oral care with pt completing oral care independently while standing at sink. Pt reports her biggest goal is to work on walking. Pt completed >200 ft of functional mobility with rw with chair follow and close supervision. Pt worked on alternating attention during functional mobility with pt naming animals in relation to letters in alphabet. Pt did skip "e" but able to problem solve correct sequence with one verbal cue. Pts gait speed did decrease slightly during cognitive task but still WFL. Pt requested to be transported back to room in w/c after ambulation. Stand pivot with rw back to recliner with supervision. pt left seated in recliner with alarm belt activated and all needs within reach.                    Therapy Documentation Precautions:  Precautions Precautions: Fall, Back Precaution Comments: no brace needed Required Braces or Orthoses: Sling Restrictions Weight Bearing Restrictions: No RUE Weight Bearing: Weight bearing as tolerated LLE Weight Bearing: Weight bearing as tolerated Other Position/Activity Restrictions: R clavicle fracture - NWB in  sling x2-3 weeks  Pain: Pain Assessment Pain Scale: 0-10 Pain Score: 0-No pain Agitated Behavior Scale: TBI Observation Details Observation Environment: pt room Start of observation period - Date: 05/05/21 Start of observation period - Time: 1100 End of observation period - Date: 05/05/21 End of observation period - Time: 1203 Agitated Behavior Scale (DO NOT LEAVE BLANKS) Short attention span, easy distractibility, inability to concentrate: Present to a slight degree Impulsive, impatient, low tolerance for pain or frustration: Absent Uncooperative, resistant to care, demanding: Absent Violent and/or threatening violence toward people or property: Absent Explosive and/or unpredictable anger: Absent Rocking, rubbing, moaning, or other self-stimulating behavior: Absent Pulling at tubes, restraints, etc.: Absent Wandering from treatment areas: Absent Restlessness, pacing, excessive movement: Absent Repetitive behaviors, motor, and/or verbal: Absent Rapid, loud, or excessive talking: Absent Sudden changes of mood: Absent Easily initiated or excessive crying and/or laughter: Absent Self-abusiveness, physical and/or verbal: Absent Agitated behavior scale total score: 15     Therapy/Group: Individual Therapy  Barron Schmid 05/05/2021, 12:46 PM

## 2021-05-05 NOTE — Progress Notes (Signed)
Physical Therapy TBI Note  Patient Details  Name: Sherry Montes MRN: [id number] Date of Birth: Jul 08, 2000  Today's Date: 05/05/2021 PT Individual Time: 0034-9179 PT Individual Time Calculation (min): 39 min   Short Term Goals: Week 1:  PT Short Term Goal 1 (Week 1): Patient will participate in at least 75% of her scheduled therapy PT Short Term Goal 1 - Progress (Week 1): Met PT Short Term Goal 2 (Week 1): Patient will transfer bed <> wc with LRAD and ModA x1 PT Short Term Goal 2 - Progress (Week 1): Met PT Short Term Goal 3 (Week 1): Patient will ambulate >5 ft with LRAD and +2 assist as needed PT Short Term Goal 3 - Progress (Week 1): Met Week 2:  PT Short Term Goal 1 (Week 2): Pt will perform bed to chair transfer with CGA. PT Short Term Goal 2 (Week 2): Pt will ambulate x150' with CGA. PT Short Term Goal 3 (Week 2): Pt will complete BERG balance test.   Skilled Therapeutic Interventions/Progress Updates:  Patient supine in bed on entrance to room. Patient alert and agreeable to PT session.   Patient with no pain complaint throughout session.  Therapeutic Activity: Bed Mobility: Patient performed supine <> sit with supervision/ Mod I requiring extra time to complete.  Transfers: Patient performed sit<>stand and stand pivot transfers throughout session with CGA/ supervision. Provided verbal cues for LLE positioning and alignment at hip joint.  Gait Training:  Patient ambulated 100 ft with no AD and CGA for balance. Demonstrated LLE IR producing toe-in at foot strike. Provided vc/ tc for maintaining toes pointed forward to align hip in more neutral position.  Neuromuscular Re-ed: NMR facilitated during session with focus on dynamic balance, LE muscle activation. Pt guided in agility ladder activities including stepping forward with both feet into each box leading with RLE x5 and LLE x5 with each pass. Progressed to lateral stepping for hip abduction strengthening as well  as increased control over LLE and increasing stance time on LE. Progressed then to single step in each square for continued challenge to single leg stance, balance, weight shift, foot positioning. Pt performs well and responds correctly to all vc for minor corrections.  NMR performed for improvements in motor control and coordination, balance, sequencing, judgement, and self confidence/ efficacy in performing all aspects of mobility at highest level of independence.   Patient supine  in bed at end of session with brakes locked, bed alarm set, and all needs within reach.     Therapy Documentation Precautions:  Precautions Precautions: Fall, Back Precaution Comments: no brace needed Required Braces or Orthoses: Sling Restrictions Weight Bearing Restrictions: No RUE Weight Bearing: Weight bearing as tolerated LLE Weight Bearing: Weight bearing as tolerated Other Position/Activity Restrictions: R clavicle fracture - NWB in sling x2-3 weeks General:   Pain: Pain Assessment Pain Scale: 0-10 Pain Score: 0-No pain Agitated Behavior Scale: TBI Observation Details Observation Environment: CIR Start of observation period - Date: 05/05/21 Start of observation period - Time: 0800 End of observation period - Date: 05/05/21 End of observation period - Time: 0845 Agitated Behavior Scale (DO NOT LEAVE BLANKS) Short attention span, easy distractibility, inability to concentrate: Absent Impulsive, impatient, low tolerance for pain or frustration: Absent Uncooperative, resistant to care, demanding: Absent Violent and/or threatening violence toward people or property: Absent Explosive and/or unpredictable anger: Absent Rocking, rubbing, moaning, or other self-stimulating behavior: Absent Pulling at tubes, restraints, etc.: Absent Wandering from treatment areas: Absent Restlessness, pacing, excessive movement: Absent  Repetitive behaviors, motor, and/or verbal: Absent Rapid, loud, or excessive  talking: Absent Sudden changes of mood: Absent Easily initiated or excessive crying and/or laughter: Absent Self-abusiveness, physical and/or verbal: Absent Agitated behavior scale total score: 14   Therapy/Group: Individual Therapy  Alger Simons PT, DPT 05/05/2021, 8:47 AM
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
## 2021-05-06 NOTE — Progress Notes (Signed)
Occupational Therapy Session Note  Patient Details  Name: Nkechi Linehan MRN: [id number] Date of Birth: November 14, 2000  Today's Date: 05/06/2021 OT Individual Time: 6580-0634 OT Individual Time Calculation (min): 60 min  and Today's Date: 05/06/2021 OT Missed Time: 15 Minutes Missed Time Reason: Other (comment) (eating breakfast)   Short Term Goals: Week 2:  OT Short Term Goal 1 (Week 2): Patient will complete toilet transfer with min A of 1 OT Short Term Goal 1 - Progress (Week 2): Met OT Short Term Goal 2 (Week 2): Patient will maintain attention to grooming task for 1 minute with moderate cues OT Short Term Goal 2 - Progress (Week 2): Met OT Short Term Goal 3 (Week 2): Patient will maintain standing balance at the sink with no more than min A in preparation for BADL tasks OT Short Term Goal 3 - Progress (Week 2): Met Week 3:  OT Short Term Goal 1 (Week 3): LTG=STG 2/2 ELOS   Skilled Therapeutic Interventions/Progress Updates:    Pt greeted at time of OT session sitting up in recliner with RN finishing up med pass, just started eating breakfast. Politely requesting a few minutes to eat breakfast before continuing OT session. Missed 15 mins at beginning of session, resumed after finishing breakfast and pt performing all mobility throughout session with supervision with RW. Pt ambulating throughout room > bathroom for shower > bed level for dressing > sink level for brushing teeth > chair for applying lotion. Pt performed shower level bathing (avoiding washing hair) with Supervision/set up only both seated and standing. Drying off same manner before walking out to bed level. UB/LB dressing with set up as well. Engaged pt in dual tasks throughout ADL for carrying conversation about school, classes, etc while sequencing ADL tasks, able to engage in alternating tasks. Pt performing short distance mobility from room <> day room with RW and again supervision. Pt mildly hyperverbal at times  throughout session but very pleasant and cooperative for all tasks. Back in room set up alarm on call bell in reach.     Therapy Documentation Precautions:  Precautions Precautions: Fall, Back Precaution Comments: no brace needed Required Braces or Orthoses: Sling Restrictions Weight Bearing Restrictions: No RUE Weight Bearing: Weight bearing as tolerated LLE Weight Bearing: Weight bearing as tolerated Other Position/Activity Restrictions: R clavicle fracture - NWB in sling x2-3 weeks    Therapy/Group: Individual Therapy  Viona Gilmore 05/06/2021, 7:26 AM

## 2021-05-06 NOTE — Progress Notes (Signed)
Physical Therapy TBI Note  Patient Details  Name: Sherry Montes MRN: [id number] Date of Birth: May 04, 2001  Today's Date: 05/06/2021 PT Individual Time: 1303-1400 PT Individual Time Calculation (min): 57 min   Short Term Goals: Week 2:  PT Short Term Goal 1 (Week 2): Pt will perform bed to chair transfer with CGA. PT Short Term Goal 2 (Week 2): Pt will ambulate x150' with CGA. PT Short Term Goal 3 (Week 2): Pt will complete BERG balance test.  Skilled Therapeutic Interventions/Progress Updates:     Patient in bed with her family friends at bedside upon PT arrival. Patient alert and agreeable to PT session. Patient denied pain during session.  Discussed d/c planning, patient plans to stay in the [place] with her parents coming here on day of or day after d/c from [place] to provided 24/7 supervision initially at d/c. Patient reports that he goal is to be able to return to school in the fall to maintain her student visa.   Therapeutic Activity: Bed Mobility: Patient performed supine to/from sit independently.  Transfers: Patient performed sit to/from stand with CGA-supervision throughout session. Provided verbal cues for equal weight bearing during transfers.  Gait Training:  Patient ambulated >100 feet, >150 feet, and >350 feet without an AD with CGA for steadying support. Ambulated with decreased L knee and hip stability in stance resulting in decreased stance time on L with mild genu recurvatum. Provided verbal cues for lateral hip and eccentric hamstring activation in stance. Patient ascended/descended 20 steps using R rail with CGA to simulate ascending to second floor of home. Performed step-to gait pattern leading with R while ascending and L while descending using teach-back method.   Neuromuscular Re-ed: Patient performed the following activities for improved motor control, visual scanning, and functional balance: -Forward and backwards propulsion x25 feet x2 on a  rolling stool for L knee ROM and hamstring activation -searching for 10 orange cones in the main therapy gym set at different heights and sides of the room, patient found all 10 in 2 min and 58 sec without cues with CGA for ambulation, reaching, and squatting to retrieve cones -side stepping 2x20 feet L/R focused on lateral hip activation   Patient sitting cross legged in bed at end of session with breaks locked, bed alarm set, and all needs within reach.   Therapy Documentation Precautions:  Precautions Precautions: Fall, Back Precaution Comments: no brace needed Required Braces or Orthoses: Sling Restrictions Weight Bearing Restrictions: No RUE Weight Bearing: Weight bearing as tolerated LLE Weight Bearing: Weight bearing as tolerated Other Position/Activity Restrictions: R clavicle fracture - NWB in sling x2-3 weeks Agitated Behavior Scale: TBI Observation Details Observation Environment: CIR Start of observation period - Date: 05/06/21 Start of observation period - Time: 1303 End of observation period - Date: 05/06/21 End of observation period - Time: 1400 Agitated Behavior Scale (DO NOT LEAVE BLANKS) Short attention span, easy distractibility, inability to concentrate: Absent Impulsive, impatient, low tolerance for pain or frustration: Absent Uncooperative, resistant to care, demanding: Absent Violent and/or threatening violence toward people or property: Absent Explosive and/or unpredictable anger: Absent Rocking, rubbing, moaning, or other self-stimulating behavior: Absent Pulling at tubes, restraints, etc.: Absent Wandering from treatment areas: Absent Restlessness, pacing, excessive movement: Absent Repetitive behaviors, motor, and/or verbal: Absent Rapid, loud, or excessive talking: Absent Sudden changes of mood: Absent Easily initiated or excessive crying and/or laughter: Absent Self-abusiveness, physical and/or verbal: Absent Agitated behavior scale total score:  14    Therapy/Group: Individual Therapy  Bay Wayson L Lemuel Boodram PT, DPT  05/06/2021, 4:09 PM

## 2021-05-07 DIAGNOSIS — S062X1S Diffuse traumatic brain injury with loss of consciousness of 30 minutes or less, sequela: Secondary | ICD-10-CM | POA: Diagnosis not present

## 2021-05-07 DIAGNOSIS — R339 Retention of urine, unspecified: Secondary | ICD-10-CM | POA: Diagnosis not present

## 2021-05-07 DIAGNOSIS — D62 Acute posthemorrhagic anemia: Secondary | ICD-10-CM | POA: Diagnosis not present

## 2021-05-07 LAB — CBC
HCT: 36.3 % (ref 36.0–46.0)
Hemoglobin: 11.8 g/dL — ABNORMAL LOW (ref 12.0–15.0)
MCH: 27.5 pg (ref 26.0–34.0)
MCHC: 32.5 g/dL (ref 30.0–36.0)
MCV: 84.6 fL (ref 80.0–100.0)
Platelets: 359 10*3/uL (ref 150–400)
RBC: 4.29 MIL/uL (ref 3.87–5.11)
RDW: 13.7 % (ref 11.5–15.5)
WBC: 5.4 10*3/uL (ref 4.0–10.5)
nRBC: 0 % (ref 0.0–0.2)

## 2021-05-07 LAB — BASIC METABOLIC PANEL
Anion gap: 6 (ref 5–15)
BUN: 7 mg/dL (ref 6–20)
CO2: 26 mmol/L (ref 22–32)
Calcium: 9.4 mg/dL (ref 8.9–10.3)
Chloride: 107 mmol/L (ref 98–111)
Creatinine, Ser: 0.59 mg/dL (ref 0.44–1.00)
GFR, Estimated: >60 mL/min (ref >60)
Glucose, Bld: 111 mg/dL — ABNORMAL HIGH (ref 70–99)
Potassium: 4.2 mmol/L (ref 3.5–5.1)
Sodium: 139 mmol/L (ref 135–145)

## 2021-05-07 MED ORDER — OXYCODONE HCL 5 MG PO TABS: 5.0000 mg | ORAL_TABLET | Freq: Every day | ORAL | Status: DC | PRN

## 2021-05-07 MED ORDER — MEGESTROL ACETATE 400 MG/10ML PO SUSP
400.0000 mg | Freq: Every day | ORAL | Status: DC
Start: 2021-05-08 — End: 2021-05-07

## 2021-05-07 MED ORDER — METHOCARBAMOL 500 MG PO TABS: 500.0000 mg | ORAL_TABLET | Freq: Three times a day (TID) | ORAL | Status: DC | PRN

## 2021-05-07 MED ORDER — MEGESTROL ACETATE 400 MG/10ML PO SUSP
400.0000 mg | Freq: Every day | ORAL | Status: DC
  Administered 2021-05-08: 400 mg via ORAL
  Filled 2021-05-07: qty 10
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
## 2021-05-07 NOTE — Progress Notes (Signed)
Pt slept through the night and was compliant with all safety protocols.

## 2021-05-07 NOTE — Progress Notes (Addendum)
Inpatient Rehabilitation Discharge Medication Review by a Pharmacist  A complete drug regimen review was completed for this patient to identify any potential clinically significant medication issues.  High Risk Drug Classes Is patient taking? Indication by Medication  Antipsychotic No   Anticoagulant No   Antibiotic No   Opioid No   Antiplatelet No   Hypoglycemics/insulin No   Vasoactive Medication No   Chemotherapy No   Other Yes Ritalin for mood Megace for appetite     Type of Medication Issue Identified Description of Issue Recommendation(s)  Drug Interaction(s) (clinically significant)     Duplicate Therapy     Allergy     No Medication Administration End Date     Incorrect Dose     Additional Drug Therapy Needed     Significant med changes from prior encounter (inform family/care partners about these prior to discharge).    Other       Clinically significant medication issues were identified that warrant physician communication and completion of prescribed/recommended actions by midnight of the next day:  No  Pharmacist comments: None  Time spent performing this drug regimen review (minutes):  20 minutes   Tad Moore 05/07/2021 1:54 PM

## 2021-05-07 NOTE — Progress Notes (Signed)
Physical Therapy Session Note  Patient Details  Name: Sherry Montes MRN: [id number] Date of Birth: 2000/05/29  Today's Date: 05/07/2021 PT Individual Time: 9242-6834 PT Individual Time Calculation (min): 24 min   Short Term Goals: Week 2:  PT Short Term Goal 1 (Week 2): Pt will perform bed to chair transfer with CGA. PT Short Term Goal 2 (Week 2): Pt will ambulate x150' with CGA. PT Short Term Goal 3 (Week 2): Pt will complete BERG balance test.  Skilled Therapeutic Interventions/Progress Updates:     Pt received seated in WC and agrees to therapy. Pt reports pain in L leg. Number not provided. PT provides rest breaks as needed to manage pain. Pt performs sit to stand with cues for initiation. Pt attempts to ambulate to hallway without RW, but PT asks pt if RW would be helpful due to pain in leg and pt is agreeable. Pt ambulates x150' with RW and CGA, with cues for upright gaze to improve posture and balance, and decreasing WB through RW for energy conservation. Pt take seated rest break prior to perform stair training. Pt completes x16 6" steps with R hand rail and CGA, with cues for sequencing of steps. Pt attempts to use reciprocal pattern initially but has difficulty ascending with L leg. Remainder of steps performed using step-to pattern and improved safety and comfort. Pt takes additional seated rest break, then performs high level gait training with side steps and backward steps. PT provides cues for upright gaze, as well as importance of weight shifting for adequate step lengths. Pt ambulates back to room with RW. Left seated with alarm intact and all needs within reach.  Therapy Documentation Precautions:  Precautions Precautions: Fall, Back Precaution Comments: no brace needed Required Braces or Orthoses: Sling Restrictions Weight Bearing Restrictions: No RUE Weight Bearing: Weight bearing as tolerated LLE Weight Bearing: Weight bearing as tolerated Other  Position/Activity Restrictions: R clavicle fracture - NWB in sling x2-3 weeks   Therapy/Group: Individual Therapy  Beau Fanny, PT, DPT 05/07/2021, 3:06 PM

## 2021-05-07 NOTE — Progress Notes (Signed)
Occupational Therapy TBI Note  Patient Details  Name: Sherry Montes MRN: [id number] Date of Birth: 09/15/2000  Today's Date: 05/07/2021 OT Individual Time: 1102-1200 OT Individual Time Calculation (min): 58 min   Short Term Goals: Week 3:  OT Short Term Goal 1 (Week 3): LTG=STG 2/2 ELOS  Skilled Therapeutic Interventions/Progress Updates:    Pt greeted semi-reclined in bed with uncle present and agreeable to OT treatment session. Pt completed bed mobility with supervision. Functional ambulation into bathroom w/ RW and supervision to transfer onto toilet. Pt voided bladder and completed toileting steps with supervision. Pt then ambulated to shower seat with min cues for RW positionong. Pt also needed cues to doff clothing prior to turning on water. Bathing completed with overall close supervision and min cues for safety awareness. Dressing performed sit<>stand at the sink with supervision. Worked on problem solving skills using slide board activity. Pt needed min cues initially, then able to complete semi-complex task with supervision. Pt returned to room and left seated in wc with needs met\ and family present.   Therapy Documentation Precautions:  Precautions Precautions: Fall, Back Precaution Comments: no brace needed Required Braces or Orthoses: Sling Restrictions Weight Bearing Restrictions: No RUE Weight Bearing: Weight bearing as tolerated LLE Weight Bearing: Weight bearing as tolerated Other Position/Activity Restrictions: R clavicle fracture - NWB in sling x2-3 weeks Pain: Pain Assessment Pain Scale: 0-10 Pain Score: 0-No pain Agitated Behavior Scale: TBI Observation Details Observation Environment: CIR Start of observation period - Date: 05/07/21 Start of observation period - Time: 1100 End of observation period - Date: 05/07/21 End of observation period - Time: 1200 Agitated Behavior Scale (DO NOT LEAVE BLANKS) Short attention span, easy distractibility,  inability to concentrate: Absent Impulsive, impatient, low tolerance for pain or frustration: Absent Uncooperative, resistant to care, demanding: Absent Violent and/or threatening violence toward people or property: Absent Explosive and/or unpredictable anger: Absent Rocking, rubbing, moaning, or other self-stimulating behavior: Absent Pulling at tubes, restraints, etc.: Absent Wandering from treatment areas: Absent Restlessness, pacing, excessive movement: Absent Repetitive behaviors, motor, and/or verbal: Absent Rapid, loud, or excessive talking: Absent Sudden changes of mood: Absent Easily initiated or excessive crying and/or laughter: Absent Self-abusiveness, physical and/or verbal: Absent Agitated behavior scale total score: 14    Therapy/Group: Individual Therapy  Valma Cava 05/07/2021, 11:57 AM

## 2021-05-07 NOTE — Progress Notes (Signed)
Speech Language Pathology TBI Note  Patient Details  Name: Sherry Montes MRN: [id number] Date of Birth: Jun 24, 2000  Today's Date: 05/07/2021 SLP Individual Time: 1030-1100 SLP Individual Time Calculation (min): 30 min  Short Term Goals: Week 3: SLP Short Term Goal 1 (Week 3): STG=LTG due to ELOS  Skilled Therapeutic Interventions: Pt seen for skilled ST with focus on cognitive goals, pt sitting upright in bed with Uncle present throughout. Pt participating in portions of the ALFA to assess reading comprehension and basic mathematics, "Solving Daily Math Problems" - 90%, "Following Writing Instructions" - 80%. Pt and uncle educated on recommendations for cognitively stimulating activities at home and activities to avoid or participate in moderation. Both verbalizing understanding and agreement. Uncle wants to bring pts cell phone tomorrow to assess how she can tolerate it. Cont ST POC.  Pain Pain Assessment Pain Scale: 0-10 Pain Score: 0-No pain  Agitated Behavior Scale: TBI Observation Details Observation Environment: pt room Start of observation period - Date: 05/07/21 Start of observation period - Time: 1030 End of observation period - Date: 05/07/21 End of observation period - Time: 1100 Agitated Behavior Scale (DO NOT LEAVE BLANKS) Short attention span, easy distractibility, inability to concentrate: Absent Impulsive, impatient, low tolerance for pain or frustration: Absent Uncooperative, resistant to care, demanding: Absent Violent and/or threatening violence toward people or property: Absent Explosive and/or unpredictable anger: Absent Rocking, rubbing, moaning, or other self-stimulating behavior: Absent Pulling at tubes, restraints, etc.: Absent Wandering from treatment areas: Absent Restlessness, pacing, excessive movement: Absent Repetitive behaviors, motor, and/or verbal: Absent Rapid, loud, or excessive talking: Absent Sudden changes of mood: Absent Easily  initiated or excessive crying and/or laughter: Absent Self-abusiveness, physical and/or verbal: Absent Agitated behavior scale total score: 14  Therapy/Group: Individual Therapy  Tacey Ruiz 05/07/2021, 12:15 PM

## 2021-05-08 ENCOUNTER — Other Ambulatory Visit (HOSPITAL_COMMUNITY): Payer: Self-pay

## 2021-05-08 ENCOUNTER — Inpatient Hospital Stay (HOSPITAL_COMMUNITY): Payer: PRIVATE HEALTH INSURANCE

## 2021-05-08 DIAGNOSIS — R339 Retention of urine, unspecified: Secondary | ICD-10-CM | POA: Diagnosis not present

## 2021-05-08 DIAGNOSIS — D62 Acute posthemorrhagic anemia: Secondary | ICD-10-CM | POA: Diagnosis not present

## 2021-05-08 DIAGNOSIS — S069X2S Unspecified intracranial injury with loss of consciousness of 31 minutes to 59 minutes, sequela: Secondary | ICD-10-CM | POA: Diagnosis not present

## 2021-05-08 MED ORDER — TRAZODONE HCL 50 MG PO TABS
25.0000 mg | ORAL_TABLET | Freq: Every evening | ORAL | 0 refills | Status: AC | PRN
Start: 2021-05-08 — End: ?
  Filled 2021-05-08: qty 15, 30d supply, fill #0

## 2021-05-08 MED ORDER — MEGESTROL ACETATE 400 MG/10ML PO SUSP
400.0000 mg | Freq: Two times a day (BID) | ORAL | 0 refills | Status: DC
Start: 2021-05-08 — End: 2021-05-08

## 2021-05-08 MED ORDER — MEGESTROL ACETATE 40 MG/ML PO SUSP
400.0000 mg | Freq: Two times a day (BID) | ORAL | 0 refills | Status: DC
  Filled 2021-05-08: qty 600, 30d supply, fill #0

## 2021-05-08 MED ORDER — METHYLPHENIDATE HCL 5 MG PO TABS
5.0000 mg | ORAL_TABLET | Freq: Every day | ORAL | 0 refills | Status: AC
Start: 2021-05-09 — End: ?
  Filled 2021-05-08: qty 30, 30d supply, fill #0

## 2021-05-08 MED ORDER — PANTOPRAZOLE SODIUM 40 MG PO TBEC
40.0000 mg | DELAYED_RELEASE_TABLET | Freq: Every day | ORAL | 0 refills | Status: DC
  Filled 2021-05-08: qty 30, 30d supply, fill #0

## 2021-05-08 MED ORDER — POLYETHYLENE GLYCOL 3350 17 GM/SCOOP PO POWD
17.0000 g | Freq: Every day | ORAL | 0 refills | Status: DC
Start: 2021-05-08 — End: 2021-05-28
  Filled 2021-05-08: qty 238, 14d supply, fill #0

## 2021-05-08 MED ORDER — DOCUSATE SODIUM 100 MG PO CAPS
100.0000 mg | ORAL_CAPSULE | Freq: Two times a day (BID) | ORAL | 0 refills | Status: DC
  Filled 2021-05-08: qty 60, 30d supply, fill #0

## 2021-05-08 MED ORDER — MEGESTROL ACETATE 400 MG/10ML PO SUSP
400.0000 mg | Freq: Two times a day (BID) | ORAL | Status: DC
  Administered 2021-05-08 – 2021-05-09 (×2): 400 mg via ORAL
  Filled 2021-05-08: qty 10

## 2021-05-08 MED ORDER — MELATONIN 3 MG PO TABS
3.0000 mg | ORAL_TABLET | Freq: Every day | ORAL | 0 refills | Status: DC
  Filled 2021-05-08: qty 30, 30d supply, fill #0

## 2021-05-08 MED ORDER — SORBITOL 70 % SOLN: 30.0000 mL | Freq: Once | Status: DC

## 2021-05-08 MED ORDER — ACETAMINOPHEN 325 MG PO TABS
325.0000 mg | ORAL_TABLET | ORAL | 0 refills | Status: DC | PRN
  Filled 2021-05-08: qty 60, 5d supply, fill #0

## 2021-05-08 NOTE — Plan of Care (Signed)
Problem: RH Problem Solving Goal: LTG Patient will demonstrate problem solving for (SLP) Description: LTG:  Patient will demonstrate problem solving for basic/complex daily situations with cues  (SLP) Outcome: Completed/Met   Problem: RH Memory Goal: LTG Patient will use memory compensatory aids to (SLP) Description: LTG:  Patient will use memory compensatory aids to recall biographical/new, daily complex information with cues (SLP) Outcome: Completed/Met   Problem: RH Attention Goal: LTG Patient will demonstrate this level of attention during functional activites (SLP) Description: LTG:  Patient will will demonstrate this level of attention during functional activites (SLP) Outcome: Completed/Met   Problem: RH Awareness Goal: LTG: Patient will demonstrate awareness during functional activites type of (SLP) Description: LTG: Patient will demonstrate awareness during functional activites type of (SLP) Outcome: Completed/Met   Problem: RH Expression Communication Goal: LTG Patient will verbally express basic/complex needs(SLP) Description: LTG:  Patient will verbally express basic/complex needs, wants or ideas with cues  (SLP) Outcome: Completed/Met

## 2021-05-08 NOTE — Progress Notes (Signed)
Occupational Therapy Discharge Summary  Patient Details  Name: Sherry Montes MRN: [id number] Date of Birth: 18-May-2000  Today's Date: 05/08/2021 OT Individual Time: 8003-4917 OT Individual Time Calculation (min): 54 min   Pt greeted semi-reclined in bed with uncle present and agreeable to OT treatment session despite feeling tired. OT treatment session focused on increased independence with BADL tasks. Pt able to access dresser drawers, collect clothing, ambulate to bathroom with RW all without assist from OT. Bathing/dressing completed supervision with minimal cues for safety awareness and RW positioning. See functional navigator for further details. Pt left semi-reclined in bed at end of session with bed alarm on, call bell in reach, and needs met.    Patient has met 9 of 9 long term goals due to improved activity tolerance, improved balance, postural control, ability to compensate for deficits, functional use of  RIGHT upper and LEFT lower extremity, improved attention, improved awareness, and improved coordination.  Patient to discharge at overall Supervision level.  Patient's care partner is independent to provide the necessary physical and cognitive assistance at discharge for higher level iADL tasks as well as to provide supervision for BADLs due to decreased safety awareness.    Reasons goals not met: n/a  Recommendation:  Patient will benefit from ongoing skilled OT services in outpatient setting to continue to advance functional skills in the area of BADL.  Equipment: 3-in-1 BSC, RW  Reasons for discharge: treatment goals met and discharge from hospital  Patient/family agrees with progress made and goals achieved: Yes  OT Discharge Precautions/Restrictions  Precautions Precautions: Fall Restrictions Weight Bearing Restrictions: Yes RUE Weight Bearing: Non weight bearing LLE Weight Bearing: Weight bearing as tolerated Other Position/Activity Restrictions: Per ortho  note pt able to use RUE for RW use Pain  Denies pain ADL ADL Eating: Independent Grooming: Independent Upper Body Bathing: Supervision/safety Lower Body Bathing: Supervision/safety Upper Body Dressing: Independent Where Assessed-Upper Body Dressing: Bed level Lower Body Dressing: Supervision/safety Where Assessed-Lower Body Dressing: Bed level Toileting: Supervision/safety Toilet Transfer: Distant supervision Social research officer, government: Close supervision Praxis Praxis: Intact Cognition Overall Cognitive Status: Impaired/Different from baseline Arousal/Alertness: Awake/alert Orientation Level: Oriented X4 Year: 2023 Month: January Day of Week: Correct Attention: Alternating;Selective;Divided Focused Attention: Appears intact Sustained Attention: Appears intact Selective Attention: Impaired Selective Attention Impairment: Verbal complex;Functional basic Alternating Attention: Impaired Alternating Attention Impairment: Verbal complex;Functional basic Divided Attention: Impaired Divided Attention Impairment: Verbal complex;Functional basic Memory: Impaired Memory Impairment: Decreased recall of new information Decreased Short Term Memory: Verbal basic;Functional basic Immediate Memory Recall: Blue;Sock;Bed Memory Recall Sock: Without Cue Memory Recall Blue: Without Cue Memory Recall Bed: Without Cue Awareness Impairment: Anticipatory impairment Problem Solving: Impaired Problem Solving Impairment: Verbal complex Executive Function: Self Correcting;Self Monitoring Self Monitoring: Impaired Self Monitoring Impairment: Verbal complex Self Correcting: Impaired Self Correcting Impairment: Verbal complex Behaviors: Restless;Impulsive;Agitated Behavior Scale Safety/Judgment: Impaired [employer] Scales of Cognitive Functioning: Purposeful/appropriate Sensation Sensation Light Touch: Appears Intact Coordination Gross Motor Movements are Fluid and Coordinated: Yes Fine  Motor Movements are Fluid and Coordinated: Yes Finger Nose Finger Test: [facility] Motor  Motor Motor: Within Functional Limits Motor - Discharge Observations: Continues to have some generalized weakness mostly in L LE Mobility  Bed Mobility Bed Mobility: Supine to Sit;Sit to Supine Rolling Right: Independent Rolling Left: Independent Supine to Sit: Independent Sit to Supine: Independent Transfers Sit to Stand: Supervision/Verbal cueing Stand to Sit: Supervision/Verbal cueing  Balance Static Sitting Balance Static Sitting - Balance Support: Feet supported;No upper extremity supported Static Sitting - Level  of Assistance: 7: Independent Dynamic Sitting Balance Dynamic Sitting - Balance Support: Feet supported;During functional activity Dynamic Sitting - Level of Assistance: 7: Independent Static Standing Balance Static Standing - Balance Support: During functional activity Static Standing - Level of Assistance: 5: Stand by assistance Dynamic Standing Balance Dynamic Standing - Balance Support: During functional activity;Bilateral upper extremity supported Dynamic Standing - Level of Assistance: 5: Stand by assistance Extremity/Trunk Assessment RUE Assessment RUE Assessment: Exceptions to [facility] Passive Range of Motion (PROM) Comments: WNL Active Range of Motion (AROM) Comments: WFL (observed during functional movement) General Strength Comments: functional ROM, limited by weight bearing precautions LUE Assessment LUE Assessment: Within Functional Limits    05/08/21 1500  Observation Details  Observation Environment CIR  Start of observation period - Date 05/08/21  Start of observation period - Time 1405  End of observation period - Date 05/08/21  End of observation period - Time 1502  Agitated Behavior Scale (DO NOT LEAVE BLANKS)  Short attention span, easy distractibility, inability to concentrate 1  Impulsive, impatient, low tolerance for pain or frustration 1  Uncooperative,  resistant to care, demanding 1  Violent and/or threatening violence toward people or property 1  Explosive and/or unpredictable anger 1  Rocking, rubbing, moaning, or other self-stimulating behavior 1  Pulling at tubes, restraints, etc. 1  Wandering from treatment areas 1  Restlessness, pacing, excessive movement 1  Repetitive behaviors, motor, and/or verbal 1  Rapid, loud, or excessive talking 1  Sudden changes of mood 1  Easily initiated or excessive crying and/or laughter 1  Self-abusiveness, physical and/or verbal 1  Agitated behavior scale total score 14      Daneen Schick Starlit Raburn 05/08/2021, 3:23 PM
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
## 2021-05-08 NOTE — Progress Notes (Signed)
Physical Therapy Discharge Summary  Patient Details  Name: Sherry Montes MRN: [id number] Date of Birth: Jan 18, 2001  Today's Date: 05/08/2021 PT Individual Time: 4098-1191 PT Individual Time Calculation (min): 43 min    Patient has met 8 of 8 long term goals due to improved activity tolerance, improved balance, improved postural control, increased strength, improved attention, improved awareness, and improved coordination.  Patient to discharge at an ambulatory level Supervision with use of a RW.  Patient's uncle and Sherry Montes will be providing supervision  and are independent to provide the necessary physical and cognitive assistance at discharge.  Reasons goals not met: NA  Recommendation:  Patient will benefit from ongoing skilled PT services in outpatient setting to continue to advance safe functional mobility, address ongoing impairments in balance, ambulation, multitasking, and minimize fall risk.  Equipment: RW  Reasons for discharge: treatment goals met and discharge from hospital  Patient/family agrees with progress made and goals achieved: Yes  Skilled therapeutic Interventions: Pt received supine in bed and agrees to therapy. Supine to sit independent. Pt perform sit to stand with cues for hand placement. Pt ambulates x300' with RW and cues for upright gaze to improve posture and balance. Pt completes car transfer and ramp navigation with cues for sequencing. Pt ambulates x150' to main gym wit RW. Pt completes BERG balance test, as detailed below. Pt ambulates back to room. Left seated in recliner with alarm intact and all needs within reach.   PT Discharge Precautions/Restrictions Precautions Precautions: Fall Restrictions Weight Bearing Restrictions: Yes RUE Weight Bearing: Non weight bearing LLE Weight Bearing: Weight bearing as tolerated Other Position/Activity Restrictions: Per ortho note pt able to use RUE for RW use Pain Interference Pain Interference Pain  Effect on Sleep: 1. Rarely or not at all Pain Interference with Therapy Activities: 2. Occasionally Pain Interference with Day-to-Day Activities: 1. Rarely or not at all Vision/Perception  Perception Perception: Within Functional Limits Praxis Praxis: Intact  Cognition Overall Cognitive Status: Impaired/Different from baseline Arousal/Alertness: Awake/alert Orientation Level: Oriented X4 Safety/Judgment: Impaired Comments: Much improved from eval [facility] Scales of Cognitive Functioning: Purposeful/appropriate Sensation Sensation Light Touch: Appears Intact Coordination Gross Motor Movements are Fluid and Coordinated: Yes Fine Motor Movements are Fluid and Coordinated: Yes Motor  Motor Motor: Within Functional Limits  Mobility Bed Mobility Bed Mobility: Supine to Sit;Sit to Supine Supine to Sit: Independent Sit to Supine: Independent Transfers Transfers: Sit to Stand;Stand to Sit Sit to Stand: Supervision/Verbal cueing Stand to Sit: Supervision/Verbal cueing Stand Pivot Transfers: Supervision/Verbal cueing Stand Pivot Transfer Details: Verbal cues for safe use of DME/AE;Verbal cues for technique Transfer (Assistive device): Rolling walker Locomotion  Gait Ambulation: Yes Gait Assistance: Supervision/Verbal cueing Gait Distance (Feet): 300 Feet Assistive device: Rolling walker Gait Assistance Details: Verbal cues for gait pattern;Verbal cues for precautions/safety Gait Gait: Yes Gait Pattern: Impaired Gait Pattern: Decreased stance time - left;Decreased stride length Stairs / Additional Locomotion Stairs: Yes Stairs Assistance: Supervision/Verbal cueing Stair Management Technique: One rail Left Number of Stairs: 16 Height of Stairs: 6 Ramp: Supervision/Verbal cueing Curb: Supervision/Verbal cueing Wheelchair Mobility Wheelchair Mobility: No  Trunk/Postural Assessment  Cervical Assessment Cervical Assessment: Within Functional Limits Thoracic  Assessment Thoracic Assessment:  (rounded shoulders) Lumbar Assessment Lumbar Assessment:  (posterior pelvic tilt) Postural Control Postural Control: Within Functional Limits  Balance Balance Balance Assessed: Yes Standardized Balance Assessment Standardized Balance Assessment: Berg Balance Test Berg Balance Test Sit to Stand: Able to stand without using hands and stabilize independently Standing Unsupported: Able to stand safely  2 minutes Sitting with Back Unsupported but Feet Supported on Floor or Stool: Able to sit safely and securely 2 minutes Stand to Sit: Sits safely with minimal use of hands Transfers: Able to transfer safely, minor use of hands Standing Unsupported with Eyes Closed: Able to stand 10 seconds safely Standing Ubsupported with Feet Together: Able to place feet together independently and stand 1 minute safely From Standing, Reach Forward with Outstretched Arm: Loses balance while trying/requires external support From Standing Position, Pick up Object from Floor: Able to pick up shoe safely and easily From Standing Position, Turn to Look Behind Over each Shoulder: Looks behind from both sides and weight shifts well Turn 360 Degrees: Able to turn 360 degrees safely but slowly Standing Unsupported, Alternately Place Feet on Step/Stool: Able to stand independently and complete 8 steps >20 seconds Standing Unsupported, One Foot in Front: Able to plae foot ahead of the other independently and hold 30 seconds Standing on One Leg: Able to lift leg independently and hold 5-10 seconds Total Score: 47 Static Sitting Balance Static Sitting - Balance Support: Feet supported;No upper extremity supported Static Sitting - Level of Assistance: 7: Independent Dynamic Sitting Balance Dynamic Sitting - Balance Support: Feet supported;During functional activity Dynamic Sitting - Level of Assistance: 7: Independent Static Standing Balance Static Standing - Balance Support: During  functional activity;Bilateral upper extremity supported Static Standing - Level of Assistance: 5: Stand by assistance Dynamic Standing Balance Dynamic Standing - Balance Support: During functional activity;Bilateral upper extremity supported Dynamic Standing - Level of Assistance: 5: Stand by assistance Extremity Assessment  RLE Assessment RLE Assessment: Within Functional Limits LLE Assessment LLE Assessment: Exceptions to [facility] General Strength Comments: Knee Extension 3+/5, limited by pain. Otherwise grossly 5/5.    Breck Coons, PT, DPT 05/08/2021, 4:17 PM

## 2021-05-08 NOTE — Progress Notes (Signed)
Patient ID: Sherry Montes, female   DOB: Apr 07, 2001, 21 y.o.   MRN: [id number]  SW met with pt and aunts in room to provide updates from team conference, d/c date remains 1/4.  SW informed from pharmacy medications not covered. SW set up pt with [facility] medication assistance program.   SW left message for pt autn [name] to inform on above and explained program. SW spoke with pt Uncle Pinakin to inform on above. SW answered questions related to alternative  [facility] and Wellness [phone]) to schedule hospital follow-up appt with Dr. Worthy Rancher on Feb. 13 at 2:30pm.  Loralee Pacas, MSW, [place] Office: [phone] Cell: [phone] Fax: [phone]

## 2021-05-08 NOTE — Patient Care Conference (Signed)
Inpatient RehabilitationTeam Conference and Plan of Care Update Date: 05/08/2021   Time: 10:48 AM    Patient Name: Sherry Montes      Medical Record Number: [id number]  Date of Birth: 04-07-2001 Sex: Female         Room/Bed: 4W12C/4W12C-02 Payor Info: Payor: GENERIC COMMERCIAL / Plan: GENERIC COMMERCIAL / Product Type: *No Product type* /    Admit Date/Time:  04/20/2021  3:27 PM  Primary Diagnosis:  TBI (traumatic brain injury)  Hospital Problems: Principal Problem:   TBI (traumatic brain injury)    Expected Discharge Date: Expected Discharge Date: 05/09/21  Team Members Present: Physician leading conference: Dr. Courtney Heys Social Worker Present: Loralee Pacas, [place] Nurse Present: Dorthula Nettles, RN PT Present: Tereasa Coop, PT OT Present: Cherylynn Ridges, OT SLP Present: Sherren Kerns, SLP PPS Coordinator present : Gunnar Fusi, SLP     Current Status/Progress Goal Weekly Team Focus  Bowel/Bladder   Patient is continent of B/B, refusing schedule colace LBM 05/07/21  regular bowel pattern  QS/PRN assessement, toileting needs   Swallow/Nutrition/ Hydration             ADL's   Supervision overall  Supevrision  BI education, self-care retraining   Mobility   supervision all mobility  Supervision overall  DC prep   Communication   mod I  sup A      Safety/Cognition/ Behavioral Observations  Rancho level Vl-Vll, sup A  Supervision  problem solving, family ed   Pain   Denies pain  remain pain free  Assess toileting needs   Skin   Skin intact and avrasions g  remain free of new skin breakdown/infection  Assess skin Q sH     Discharge Planning:  Pt will d/c to home with aunt/uncle until parents arrive from [place]. Pt will have support from family. Fam meeting completed on Friday (12/29) with medical team. Pt will have outpatient PT/OT/SLP at [facility]. DME: RW and 3in1 BSC ordered with [employer].   Team Discussion: Therapy reporting not  inappropriate during sessions. Continent B/B. Incision to left leg clean and dry. Family meeting went well. Parents should arrive to [place] soon. Supervision for ADL's, cues for safety in shower. Grad day and met all goals. No need to update Behavior Plan due to discharge 05/09/21. Patient on target to meet rehab goals: yes, supervision goals met.  *See Care Plan and progress notes for long and short-term goals.   Revisions to Treatment Plan:  Finalizing discharge plans.  Teaching Needs: Family education completed.  Current Barriers to Discharge: Wound care, Weight bearing restrictions, Medication compliance, and BI education  Possible Resolutions to Barriers: Family education BI education Outpatient follow-up in place DME ordered     Medical Summary Current Status: LBM this AM;  somewhat inappropriate but much better- continent B/B; denies pain; Incision LLE-  Barriers to Discharge: Home enviroment access/layout;Weight bearing restrictions;Weight;Wound care;Medication compliance  Barriers to Discharge Comments: family mtg Friday- went well- doing outpt- equipment ordered Possible Resolutions to [employer]: out on enclosure bed- family educaiton today for d/c tomorrow 05/09/21   Continued Need for Acute Rehabilitation Level of Care: The patient requires daily medical management by a physician with specialized training in physical medicine and rehabilitation for the following reasons: Direction of a multidisciplinary physical rehabilitation program to maximize functional independence : Yes Medical management of patient stability for increased activity during participation in an intensive rehabilitation regime.: Yes Analysis of laboratory values and/or radiology reports with any subsequent need  for medication adjustment and/or medical intervention. : Yes   I attest that I was present, lead the team conference, and concur with the assessment and plan of the team.   Cristi Loron 05/08/2021, 3:08 PM

## 2021-05-08 NOTE — Plan of Care (Signed)
Problem: RH Eating Goal: LTG Patient will perform eating w/assist, cues/equip (OT) Description: LTG: Patient will perform eating with assist, with/without cues using equipment (OT) Outcome: Completed/Met   Problem: RH Grooming Goal: LTG Patient will perform grooming w/assist,cues/equip (OT) Description: LTG: Patient will perform grooming with assist, with/without cues using equipment (OT) Outcome: Completed/Met   Problem: RH Bathing Goal: LTG Patient will bathe all body parts with assist levels (OT) Description: LTG: Patient will bathe all body parts with assist levels (OT) Outcome: Completed/Met   Problem: RH Dressing Goal: LTG Patient will perform upper body dressing (OT) Description: LTG Patient will perform upper body dressing with assist, with/without cues (OT). Outcome: Completed/Met Goal: LTG Patient will perform lower body dressing w/assist (OT) Description: LTG: Patient will perform lower body dressing with assist, with/without cues in positioning using equipment (OT) Outcome: Completed/Met   Problem: RH Toileting Goal: LTG Patient will perform toileting task (3/3 steps) with assistance level (OT) Description: LTG: Patient will perform toileting task (3/3 steps) with assistance level (OT)  Outcome: Completed/Met   Problem: RH Toilet Transfers Goal: LTG Patient will perform toilet transfers w/assist (OT) Description: LTG: Patient will perform toilet transfers with assist, with/without cues using equipment (OT) Outcome: Completed/Met   Problem: RH Tub/Shower Transfers Goal: LTG Patient will perform tub/shower transfers w/assist (OT) Description: LTG: Patient will perform tub/shower transfers with assist, with/without cues using equipment (OT) Outcome: Completed/Met

## 2021-05-08 NOTE — Progress Notes (Signed)
Speech Language Pathology Discharge Summary  Patient Details  Name: Sherry Montes MRN: [id number] Date of Birth: 06-20-2000  Today's Date: 05/08/2021 SLP Individual Time: 0920-1000 SLP Individual Time Calculation (min): 40 min  Skilled Therapeutic Interventions:  Skilled ST treatment focused on cognitive goals. Patient agreeable to ambulate to therapy gym using RW and CGA. Patient independently requested to use RW. SLP facilitated session by using BITS for working memory and alternating attention. Prior to task SLP educated on internal and external memory strategies to implement including repetition, association, and chunking. Patient implemented strategies with sup A while completing a memory task were she recalled a series of novel words (up to 8) in sequential order to achieve 75% accuracy. She completed this task x2 for 5 minute duration with short (3-5 minute) breaks in between. Patient then completed trail making in which she completed with 100% accuracy and with modified independence for implementation of attention strategies. Patient ambulated back to room using RW and CGA, was left in recliner with alarm activated and immediate needs within reach at end of session.   Patient has met 5 of 5 long term goals.  Patient to discharge at overall Supervision level.  Reasons goals not met: N/A   Clinical Impression/Discharge Summary: Patient has made excellent gains and has met 5 of 5 long-term goals this admission. Patient has emerged into a Rancho Level Vll/Vlll. Patient is currently an overall supervision A in regards to higher level cognitive tasks such as complex problem solving, selective and alternating attention, short-term recall, and anticipatory awareness which impact her independence with functional tasks. Pt is making great progress and expected to continue to make improvements with follow up speech therapy services. Patient and family education is complete and patient to discharge  at overall supervision level. Patient's care partner is independent to provide the necessary physical and cognitive assistance at discharge. Patient would benefit from continued SLP services in outpatient setting to maximize cognitive function and functional independence.    Care Partner:  Caregiver Able to Provide Assistance: Yes  Type of Caregiver Assistance: Physical;Cognitive  Recommendation:  24 hour supervision/assistance;Outpatient SLP  Rationale for SLP Follow Up: Maximize cognitive function and independence;Reduce caregiver burden   Equipment: None   Reasons for discharge: Discharged from hospital;Treatment goals met   Patient/Family Agrees with Progress Made and Goals Achieved: Yes    [name] 05/08/2021, 12:20 PM

## 2021-05-09 ENCOUNTER — Inpatient Hospital Stay (HOSPITAL_COMMUNITY): Payer: PRIVATE HEALTH INSURANCE

## 2021-05-09 ENCOUNTER — Other Ambulatory Visit (HOSPITAL_COMMUNITY): Payer: Self-pay

## 2021-05-09 DIAGNOSIS — S069X2S Unspecified intracranial injury with loss of consciousness of 31 minutes to 59 minutes, sequela: Secondary | ICD-10-CM | POA: Diagnosis not present

## 2021-05-09 DIAGNOSIS — D62 Acute posthemorrhagic anemia: Secondary | ICD-10-CM | POA: Diagnosis not present

## 2021-05-09 DIAGNOSIS — R339 Retention of urine, unspecified: Secondary | ICD-10-CM | POA: Diagnosis not present
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
## 2021-05-09 NOTE — Progress Notes (Signed)
Patient ID: Sherry Montes, female   DOB: 2000-10-22, 21 y.o.   MRN: [id number]  Patient/Family Conference  Patient/family in attendance: Dewain Penning, Uncle Pinakin and [name]'s SIL who will be helping  Staff in [place] (SLP), Grayland Ormond Doe (OT), Dewaine Conger (PT, Dr. Naaman Plummer (Attending), Loralee Pacas (SW)  Main focus: discussed pt current condition and plan of care at discharge.   Synopsis of information shared: Discussed progress with ADLs/IADLS in which pt will continue to require assistance with walking due to balance, beign mindful of clavicle fracture to avoid further injury, reduce electronics to avoid over stimulation, and continuing to work on orientation/awareness/attention.  Barriers/concerns expressed by patient and family: Requested a letter for school since pt will need to be off Spring Semester, and how to best provide care to patient.   Patient/family response: Pt family demonstrated understanding.   Follow-up/action plans: Family will remain until 2pm to complete family education. SW will order DME as requested and send referral to [facility] for PT/OT/SLP.  Loralee Pacas, MSW, [place] Office: [phone] Cell: [phone] Fax: [phone]

## 2021-05-09 NOTE — Progress Notes (Signed)
Inpatient Rehabilitation Care Coordinator Discharge Note   Patient Details  Name: Sherry Montes MRN: [id number] Date of Birth: [birth year]   Discharge location: D/c to home with relatives until parents arrive from [place]  Length of Stay: 18 days  Discharge activity level: level Supervision with use of a RW  Home/community participation: Limited  Patient response QZ:RAQTMA Literacy - How often do you need to have someone help you when you read instructions, pamphlets, or other written material from your doctor or pharmacy?: Never  Patient response UQ:JFHLKT Isolation - How often do you feel lonely or isolated from those around you?: Never  Services provided included: MD, RD, PT, OT, RN, SLP, CM, TR, Pharmacy, Neuropsych, SW  Financial Services:  Field seismologist Utilized: Community education officer (international insurance)  Choices offered to/list presented to: Yes  Follow-up services arranged:  DME, Outpatient    Outpatient Servicies: [facility] for PT/OT/SLP DME : [employer] for RW   Patient response to transportation need: Is the patient able to respond to transportation needs?: Yes In the past 12 months, has lack of transportation kept you from medical appointments or from getting medications?: No In the past 12 months, has lack of transportation kept you from meetings, work, or from getting things needed for daily living?: No  Comments (or additional information): Hospital follow-up appointment at [facility] with Dr. Toy Baker on February 13 at 2:30pm.  Patient/Family verbalized understanding of follow-up arrangements:  Yes  Individual responsible for coordination of the follow-up plan: contact pt aunt [name]#[phone]  or pt uncle Pinakin [phone]  Confirmed correct DME delivered: Gretchen Short 05/09/2021    Gretchen Short
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
## 2021-05-14 ENCOUNTER — Ambulatory Visit: Payer: PRIVATE HEALTH INSURANCE | Attending: Physical Medicine and Rehabilitation | Admitting: Occupational Therapy

## 2021-05-14 ENCOUNTER — Encounter: Payer: Self-pay | Admitting: Physical Therapy

## 2021-05-14 ENCOUNTER — Ambulatory Visit: Payer: PRIVATE HEALTH INSURANCE | Admitting: Physical Therapy

## 2021-05-14 ENCOUNTER — Ambulatory Visit: Payer: PRIVATE HEALTH INSURANCE

## 2021-05-14 ENCOUNTER — Encounter: Payer: Self-pay | Admitting: Occupational Therapy

## 2021-05-14 ENCOUNTER — Other Ambulatory Visit: Payer: Self-pay

## 2021-05-14 DIAGNOSIS — R2681 Unsteadiness on feet: Secondary | ICD-10-CM | POA: Insufficient documentation

## 2021-05-14 DIAGNOSIS — M25511 Pain in right shoulder: Secondary | ICD-10-CM | POA: Diagnosis present

## 2021-05-14 DIAGNOSIS — R2689 Other abnormalities of gait and mobility: Secondary | ICD-10-CM

## 2021-05-14 DIAGNOSIS — M25562 Pain in left knee: Secondary | ICD-10-CM | POA: Insufficient documentation

## 2021-05-14 DIAGNOSIS — R41841 Cognitive communication deficit: Secondary | ICD-10-CM | POA: Insufficient documentation

## 2021-05-14 DIAGNOSIS — M545 Low back pain, unspecified: Secondary | ICD-10-CM

## 2021-05-14 DIAGNOSIS — R278 Other lack of coordination: Secondary | ICD-10-CM | POA: Diagnosis present

## 2021-05-14 DIAGNOSIS — M6281 Muscle weakness (generalized): Secondary | ICD-10-CM | POA: Insufficient documentation

## 2021-05-14 DIAGNOSIS — R41840 Attention and concentration deficit: Secondary | ICD-10-CM | POA: Diagnosis present

## 2021-05-14 NOTE — Patient Instructions (Signed)
° °  [facility] 15% off code: [id number]  Make a loose schedule for the day, write it down and stick to it. Have mom and auntie help you as necessary. Set alarms in your phone if you need to, in order to remember when you are going to do things on your schedule.
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
## 2021-05-22 ENCOUNTER — Encounter: Payer: Self-pay | Admitting: Physical Therapy

## 2021-05-22 ENCOUNTER — Ambulatory Visit: Payer: PRIVATE HEALTH INSURANCE | Admitting: Physical Therapy

## 2021-05-22 ENCOUNTER — Ambulatory Visit: Payer: PRIVATE HEALTH INSURANCE | Admitting: Occupational Therapy

## 2021-05-22 ENCOUNTER — Ambulatory Visit: Payer: PRIVATE HEALTH INSURANCE

## 2021-05-22 ENCOUNTER — Encounter: Payer: Self-pay | Admitting: Occupational Therapy

## 2021-05-22 ENCOUNTER — Other Ambulatory Visit: Payer: Self-pay

## 2021-05-22 DIAGNOSIS — M25511 Pain in right shoulder: Secondary | ICD-10-CM

## 2021-05-22 DIAGNOSIS — R2689 Other abnormalities of gait and mobility: Secondary | ICD-10-CM

## 2021-05-22 DIAGNOSIS — M6281 Muscle weakness (generalized): Secondary | ICD-10-CM

## 2021-05-22 DIAGNOSIS — R2681 Unsteadiness on feet: Secondary | ICD-10-CM

## 2021-05-22 DIAGNOSIS — R278 Other lack of coordination: Secondary | ICD-10-CM

## 2021-05-22 DIAGNOSIS — R41840 Attention and concentration deficit: Secondary | ICD-10-CM

## 2021-05-22 DIAGNOSIS — R41841 Cognitive communication deficit: Secondary | ICD-10-CM

## 2021-05-22 NOTE — Patient Instructions (Signed)
Access Code: [email or web address] URL: [email or web address] Date: 05/22/2021 Prepared by: [facility]  Exercises Seated Toe Raise - 1-2 x daily - 7 x weekly - 2 sets - 10 reps Side to side weightshift - 1-2 x daily - 7 x weekly - 1-2 sets - 10 reps Standing Hip Abduction with Counter Support - 1-2 x daily - 7 x weekly - 1 sets - 10 reps March in Place - 1-2 x daily - 7 x weekly - 1 sets - 10 reps
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
## 2021-05-22 NOTE — Patient Instructions (Signed)
°  Coordination Activities  Perform the following activities for 10-15 minutes 1 times per day with right hand(s).  Rotate ball in fingertips (clockwise and counter-clockwise). Toss ball between hands. Toss ball in air and catch with the same hand. Pick up coins, buttons, marbles, dried beans/pasta of different sizes and place in container. Pick up coins and place in container or coin bank. Pick up coins and stack. Pick up coins one at a time until you get 5-10 in your hand, then move coins from palm to fingertips to stack one at a time.

## 2021-05-23 ENCOUNTER — Encounter: Payer: PRIVATE HEALTH INSURANCE | Admitting: Registered Nurse
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
## 2021-05-23 NOTE — Patient Instructions (Signed)
  Please complete the assigned speech therapy homework prior to your next session and return it to the speech therapist at your next visit.  

## 2021-05-28 ENCOUNTER — Ambulatory Visit: Payer: PRIVATE HEALTH INSURANCE | Admitting: Rehabilitative and Restorative Service Providers"

## 2021-05-28 ENCOUNTER — Encounter: Payer: Self-pay | Admitting: Occupational Therapy

## 2021-05-28 ENCOUNTER — Ambulatory Visit: Payer: PRIVATE HEALTH INSURANCE

## 2021-05-28 ENCOUNTER — Encounter: Payer: Self-pay | Attending: Registered Nurse | Admitting: Physical Medicine and Rehabilitation

## 2021-05-28 ENCOUNTER — Ambulatory Visit: Payer: PRIVATE HEALTH INSURANCE | Admitting: Occupational Therapy

## 2021-05-28 ENCOUNTER — Other Ambulatory Visit: Payer: Self-pay

## 2021-05-28 ENCOUNTER — Encounter: Payer: Self-pay | Admitting: Rehabilitative and Restorative Service Providers"

## 2021-05-28 DIAGNOSIS — R2681 Unsteadiness on feet: Secondary | ICD-10-CM

## 2021-05-28 DIAGNOSIS — R262 Difficulty in walking, not elsewhere classified: Secondary | ICD-10-CM | POA: Insufficient documentation

## 2021-05-28 DIAGNOSIS — M25569 Pain in unspecified knee: Secondary | ICD-10-CM | POA: Insufficient documentation

## 2021-05-28 DIAGNOSIS — F32A Depression, unspecified: Secondary | ICD-10-CM | POA: Insufficient documentation

## 2021-05-28 DIAGNOSIS — M6281 Muscle weakness (generalized): Secondary | ICD-10-CM | POA: Diagnosis not present

## 2021-05-28 DIAGNOSIS — M25562 Pain in left knee: Secondary | ICD-10-CM

## 2021-05-28 DIAGNOSIS — Z560 Unemployment, unspecified: Secondary | ICD-10-CM | POA: Insufficient documentation

## 2021-05-28 DIAGNOSIS — R2689 Other abnormalities of gait and mobility: Secondary | ICD-10-CM

## 2021-05-28 DIAGNOSIS — M79606 Pain in leg, unspecified: Secondary | ICD-10-CM | POA: Insufficient documentation

## 2021-05-28 DIAGNOSIS — X58XXXS Exposure to other specified factors, sequela: Secondary | ICD-10-CM | POA: Insufficient documentation

## 2021-05-28 DIAGNOSIS — M25511 Pain in right shoulder: Secondary | ICD-10-CM

## 2021-05-28 DIAGNOSIS — M549 Dorsalgia, unspecified: Secondary | ICD-10-CM | POA: Insufficient documentation

## 2021-05-28 DIAGNOSIS — G4700 Insomnia, unspecified: Secondary | ICD-10-CM | POA: Insufficient documentation

## 2021-05-28 DIAGNOSIS — R278 Other lack of coordination: Secondary | ICD-10-CM

## 2021-05-28 DIAGNOSIS — M545 Low back pain, unspecified: Secondary | ICD-10-CM

## 2021-05-28 DIAGNOSIS — S069X0S Unspecified intracranial injury without loss of consciousness, sequela: Secondary | ICD-10-CM | POA: Insufficient documentation

## 2021-05-28 DIAGNOSIS — R41841 Cognitive communication deficit: Secondary | ICD-10-CM

## 2021-05-28 DIAGNOSIS — R41840 Attention and concentration deficit: Secondary | ICD-10-CM

## 2021-05-28 DIAGNOSIS — G4701 Insomnia due to medical condition: Secondary | ICD-10-CM
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
## 2021-05-28 NOTE — Patient Instructions (Signed)
Access Code: [email or web address] URL: [email or web address] Date: 05/28/2021 Prepared by: Margretta Ditty  Exercises Straight Leg Raise with External Rotation - 2 x daily - 7 x weekly - 1 sets - 5-10 reps Supine Bridge - 2 x daily - 7 x weekly - 1 sets - 5-10 reps Seated Toe Raise - 1-2 x daily - 7 x weekly - 2 sets - 10 reps Side to side weightshift - 1-2 x daily - 7 x weekly - 1-2 sets - 10 reps Standing Hip Abduction with Counter Support - 1-2 x daily - 7 x weekly - 1 sets - 10 reps March in Place - 1-2 x daily - 7 x weekly - 1 sets - 10 reps
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
## 2021-05-31 ENCOUNTER — Other Ambulatory Visit: Payer: Self-pay

## 2021-05-31 ENCOUNTER — Encounter: Payer: Self-pay | Admitting: Occupational Therapy

## 2021-05-31 ENCOUNTER — Encounter: Payer: Self-pay | Admitting: Physical Therapy

## 2021-05-31 ENCOUNTER — Ambulatory Visit: Payer: PRIVATE HEALTH INSURANCE

## 2021-05-31 ENCOUNTER — Ambulatory Visit: Payer: PRIVATE HEALTH INSURANCE | Admitting: Occupational Therapy

## 2021-05-31 ENCOUNTER — Ambulatory Visit: Payer: PRIVATE HEALTH INSURANCE | Admitting: Physical Therapy

## 2021-05-31 DIAGNOSIS — R278 Other lack of coordination: Secondary | ICD-10-CM

## 2021-05-31 DIAGNOSIS — R2681 Unsteadiness on feet: Secondary | ICD-10-CM

## 2021-05-31 DIAGNOSIS — R2689 Other abnormalities of gait and mobility: Secondary | ICD-10-CM

## 2021-05-31 DIAGNOSIS — M6281 Muscle weakness (generalized): Secondary | ICD-10-CM

## 2021-05-31 DIAGNOSIS — R41840 Attention and concentration deficit: Secondary | ICD-10-CM

## 2021-05-31 DIAGNOSIS — M25511 Pain in right shoulder: Secondary | ICD-10-CM

## 2021-05-31 DIAGNOSIS — R41841 Cognitive communication deficit: Secondary | ICD-10-CM
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
## 2021-05-31 NOTE — Patient Instructions (Signed)
Access Code: [email or web address] URL: [email or web address] Date: 05/31/2021 Prepared by: [facility] - Outpatient Rehab - [facility]  Exercises Seated Scapular Retraction - 1-2 x daily - 7 x weekly - 2 sets - 10 reps - 5 seconds hold Supine Shoulder Flexion AAROM with Dowel - 1-2 x daily - 7 x weekly - 2 sets - 10 reps - 10 seconds hold Open Book Chest Stretch on Towel Roll - 1-2 x daily - 7 x weekly - 2 sets - 10 reps Wall Angels - 1-2 x daily - 7 x weekly - 2 sets - 10 reps
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
## 2021-06-05 ENCOUNTER — Encounter: Payer: Self-pay | Admitting: Physical Therapy

## 2021-06-05 ENCOUNTER — Ambulatory Visit: Payer: PRIVATE HEALTH INSURANCE

## 2021-06-05 ENCOUNTER — Other Ambulatory Visit: Payer: Self-pay

## 2021-06-05 ENCOUNTER — Ambulatory Visit: Payer: PRIVATE HEALTH INSURANCE | Admitting: Physical Therapy

## 2021-06-05 ENCOUNTER — Ambulatory Visit: Payer: PRIVATE HEALTH INSURANCE | Admitting: Occupational Therapy

## 2021-06-05 ENCOUNTER — Encounter: Payer: Self-pay | Admitting: Occupational Therapy

## 2021-06-05 DIAGNOSIS — R2681 Unsteadiness on feet: Secondary | ICD-10-CM

## 2021-06-05 DIAGNOSIS — R41840 Attention and concentration deficit: Secondary | ICD-10-CM

## 2021-06-05 DIAGNOSIS — R2689 Other abnormalities of gait and mobility: Secondary | ICD-10-CM

## 2021-06-05 DIAGNOSIS — M6281 Muscle weakness (generalized): Secondary | ICD-10-CM

## 2021-06-05 DIAGNOSIS — M25511 Pain in right shoulder: Secondary | ICD-10-CM

## 2021-06-05 DIAGNOSIS — R278 Other lack of coordination: Secondary | ICD-10-CM

## 2021-06-05 NOTE — Patient Instructions (Signed)
Access Code: [email or web address] URL: [email or web address] Date: 06/05/2021 Prepared by: [facility] - Outpatient Rehab - [facility]  Exercises Straight Leg Raise with External Rotation - 2 x daily - 7 x weekly - 1 sets - 5-10 reps Seated Toe Raise - 1-2 x daily - 7 x weekly - 2 sets - 10 reps Side to side weightshift - 1-2 x daily - 7 x weekly - 1-2 sets - 10 reps Standing Hip Abduction with Counter Support - 1-2 x daily - 7 x weekly - 1 sets - 10 reps March in Place - 1-2 x daily - 7 x weekly - 1 sets - 10 reps Seated Figure 4 Piriformis Stretch - 1 x daily - 5 x weekly - 2 sets - 30 sec hold Clamshell with Resistance - 1 x daily - 5 x weekly - 2 sets - 10 reps
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
## 2021-06-06 ENCOUNTER — Ambulatory Visit: Payer: PRIVATE HEALTH INSURANCE

## 2021-06-06 ENCOUNTER — Encounter: Payer: Self-pay | Admitting: Physical Therapy

## 2021-06-06 ENCOUNTER — Encounter: Payer: Self-pay | Admitting: Occupational Therapy

## 2021-06-06 ENCOUNTER — Ambulatory Visit: Payer: PRIVATE HEALTH INSURANCE | Attending: Physical Medicine and Rehabilitation | Admitting: Occupational Therapy

## 2021-06-06 ENCOUNTER — Ambulatory Visit: Payer: PRIVATE HEALTH INSURANCE | Admitting: Physical Therapy

## 2021-06-06 DIAGNOSIS — M6281 Muscle weakness (generalized): Secondary | ICD-10-CM

## 2021-06-06 DIAGNOSIS — R2681 Unsteadiness on feet: Secondary | ICD-10-CM | POA: Diagnosis present

## 2021-06-06 DIAGNOSIS — M25511 Pain in right shoulder: Secondary | ICD-10-CM | POA: Diagnosis present

## 2021-06-06 DIAGNOSIS — R278 Other lack of coordination: Secondary | ICD-10-CM | POA: Diagnosis present

## 2021-06-06 DIAGNOSIS — R41840 Attention and concentration deficit: Secondary | ICD-10-CM | POA: Insufficient documentation

## 2021-06-06 DIAGNOSIS — R41841 Cognitive communication deficit: Secondary | ICD-10-CM | POA: Insufficient documentation

## 2021-06-06 DIAGNOSIS — M545 Low back pain, unspecified: Secondary | ICD-10-CM | POA: Insufficient documentation

## 2021-06-06 DIAGNOSIS — R2689 Other abnormalities of gait and mobility: Secondary | ICD-10-CM | POA: Diagnosis present

## 2021-06-06 DIAGNOSIS — M25562 Pain in left knee: Secondary | ICD-10-CM | POA: Insufficient documentation
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
## 2021-06-06 NOTE — Patient Instructions (Signed)
  Please complete the assigned speech therapy homework prior to your next session and return it to the speech therapist at your next visit.  
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
## 2021-06-12 ENCOUNTER — Encounter: Payer: Self-pay | Admitting: Physical Therapy

## 2021-06-12 ENCOUNTER — Other Ambulatory Visit: Payer: Self-pay

## 2021-06-12 ENCOUNTER — Encounter: Payer: Self-pay | Admitting: Occupational Therapy

## 2021-06-12 ENCOUNTER — Ambulatory Visit: Payer: PRIVATE HEALTH INSURANCE | Admitting: Physical Therapy

## 2021-06-12 ENCOUNTER — Ambulatory Visit: Payer: PRIVATE HEALTH INSURANCE | Admitting: Occupational Therapy

## 2021-06-12 ENCOUNTER — Encounter: Payer: Self-pay | Admitting: Psychology

## 2021-06-12 DIAGNOSIS — R278 Other lack of coordination: Secondary | ICD-10-CM

## 2021-06-12 DIAGNOSIS — R2681 Unsteadiness on feet: Secondary | ICD-10-CM

## 2021-06-12 DIAGNOSIS — R2689 Other abnormalities of gait and mobility: Secondary | ICD-10-CM

## 2021-06-12 DIAGNOSIS — M25511 Pain in right shoulder: Secondary | ICD-10-CM

## 2021-06-12 DIAGNOSIS — R41840 Attention and concentration deficit: Secondary | ICD-10-CM

## 2021-06-12 DIAGNOSIS — M6281 Muscle weakness (generalized): Secondary | ICD-10-CM
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
## 2021-06-14 ENCOUNTER — Ambulatory Visit: Payer: PRIVATE HEALTH INSURANCE | Admitting: Physical Therapy

## 2021-06-14 ENCOUNTER — Encounter: Payer: Self-pay | Admitting: Occupational Therapy

## 2021-06-14 ENCOUNTER — Other Ambulatory Visit: Payer: Self-pay

## 2021-06-14 ENCOUNTER — Ambulatory Visit: Payer: PRIVATE HEALTH INSURANCE | Admitting: Occupational Therapy

## 2021-06-14 ENCOUNTER — Encounter: Payer: Self-pay | Admitting: Physical Therapy

## 2021-06-14 ENCOUNTER — Ambulatory Visit: Payer: PRIVATE HEALTH INSURANCE

## 2021-06-14 DIAGNOSIS — R2689 Other abnormalities of gait and mobility: Secondary | ICD-10-CM

## 2021-06-14 DIAGNOSIS — M6281 Muscle weakness (generalized): Secondary | ICD-10-CM | POA: Diagnosis not present

## 2021-06-14 DIAGNOSIS — R2681 Unsteadiness on feet: Secondary | ICD-10-CM

## 2021-06-14 DIAGNOSIS — R41840 Attention and concentration deficit: Secondary | ICD-10-CM

## 2021-06-14 DIAGNOSIS — R278 Other lack of coordination: Secondary | ICD-10-CM

## 2021-06-14 DIAGNOSIS — M25511 Pain in right shoulder: Secondary | ICD-10-CM

## 2021-06-14 DIAGNOSIS — R41841 Cognitive communication deficit: Secondary | ICD-10-CM
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
## 2021-06-14 NOTE — Patient Instructions (Signed)
  Please complete the assigned speech therapy homework prior to your next session and return it to the speech therapist at your next visit.  
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
## 2021-06-18 ENCOUNTER — Ambulatory Visit: Payer: PRIVATE HEALTH INSURANCE | Attending: Family Medicine | Admitting: Family Medicine

## 2021-06-18 ENCOUNTER — Telehealth: Payer: Self-pay | Admitting: Family Medicine

## 2021-06-18 ENCOUNTER — Encounter: Payer: Self-pay | Admitting: Family Medicine

## 2021-06-18 VITALS — BP 106/75 | HR 97 | Ht 63.0 in | Wt 138.8 lb

## 2021-06-18 DIAGNOSIS — R600 Localized edema: Secondary | ICD-10-CM

## 2021-06-18 DIAGNOSIS — T148XXA Other injury of unspecified body region, initial encounter: Secondary | ICD-10-CM | POA: Diagnosis not present

## 2021-06-18 DIAGNOSIS — R269 Unspecified abnormalities of gait and mobility: Secondary | ICD-10-CM | POA: Diagnosis not present

## 2021-06-18 DIAGNOSIS — S069X2S Unspecified intracranial injury with loss of consciousness of 31 minutes to 59 minutes, sequela: Secondary | ICD-10-CM

## 2021-06-18 NOTE — Patient Instructions (Signed)
Edema ?Edema is when you have too much fluid in your body or under your skin. Edema may make your legs, feet, and ankles swell. Swelling often happens in looser tissues, such as around your eyes. This is a common condition. It gets more common as you get older. ?There are many possible causes of edema. These include: ?Eating too much salt (sodium). ?Being on your feet or sitting for a long time. ?Certain medical conditions, such as: ?Pregnancy. ?Heart failure. ?Liver disease. ?Kidney disease. ?Cancer. ?Hot weather may make edema worse. Edema is usually painless. Your skin may look swollen or shiny. ?Follow these instructions at home: ?Medicines ?Take over-the-counter and prescription medicines only as told by your doctor. ?Your doctor may prescribe a medicine to help your body get rid of extra water (diuretic). Take this medicine if you are told to take it. ?Eating and drinking ?Eat a low-salt (low-sodium) diet as told by your doctor. Sometimes, eating less salt may reduce swelling. ?Depending on the cause of your swelling, you may need to limit how much fluid you drink (fluid restriction). ?General instructions ?Raise the injured area above the level of your heart while you are sitting or lying down. ?Do not sit still or stand for a long time. ?Do not wear tight clothes. Do not wear garters on your upper legs. ?Exercise your legs. This can help the swelling go down. ?Wear compression stockings as told by your doctor. It is important that these are the right size. These should be prescribed by your doctor to prevent possible injuries. ?If elastic bandages or wraps are recommended, use them as told by your doctor. ?Contact a doctor if: ?Treatment is not working. ?You have heart, liver, or kidney disease and have symptoms of edema. ?You have sudden and unexplained weight gain. ?Get help right away if: ?You have shortness of breath or chest pain. ?You cannot breathe when you lie down. ?You have pain, redness, or warmth  in the swollen areas. ?You have heart, liver, or kidney disease and get edema all of a sudden. ?You have a fever and your symptoms get worse all of a sudden. ?These symptoms may be an emergency. Get help right away. Call 911. ?Do not wait to see if the symptoms will go away. ?Do not drive yourself to the hospital. ?Summary ?Edema is when you have too much fluid in your body or under your skin. ?Edema may make your legs, feet, and ankles swell. Swelling often happens in looser tissues, such as around your eyes. ?Raise the injured area above the level of your heart while you are sitting or lying down. ?Follow your doctor's instructions about diet and how much fluid you can drink. ?This information is not intended to replace advice given to you by your health care provider. Make sure you discuss any questions you have with your health care provider. ?Document Revised: 12/25/2020 Document Reviewed: 12/25/2020 ?Elsevier Patient Education ? 2022 [employer] ? ?

## 2021-06-18 NOTE — Progress Notes (Signed)
Off balance Bruise on wrist Legs swell after walking too long.
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
## 2021-06-19 ENCOUNTER — Ambulatory Visit: Payer: PRIVATE HEALTH INSURANCE | Admitting: Occupational Therapy

## 2021-06-19 ENCOUNTER — Ambulatory Visit: Payer: PRIVATE HEALTH INSURANCE

## 2021-06-19 ENCOUNTER — Other Ambulatory Visit: Payer: Self-pay

## 2021-06-19 ENCOUNTER — Encounter: Payer: Self-pay | Admitting: Occupational Therapy

## 2021-06-19 DIAGNOSIS — R2681 Unsteadiness on feet: Secondary | ICD-10-CM

## 2021-06-19 DIAGNOSIS — M6281 Muscle weakness (generalized): Secondary | ICD-10-CM

## 2021-06-19 DIAGNOSIS — R41840 Attention and concentration deficit: Secondary | ICD-10-CM

## 2021-06-19 DIAGNOSIS — R2689 Other abnormalities of gait and mobility: Secondary | ICD-10-CM

## 2021-06-19 DIAGNOSIS — M25562 Pain in left knee: Secondary | ICD-10-CM

## 2021-06-19 DIAGNOSIS — M25511 Pain in right shoulder: Secondary | ICD-10-CM

## 2021-06-19 DIAGNOSIS — R41841 Cognitive communication deficit: Secondary | ICD-10-CM

## 2021-06-19 DIAGNOSIS — R278 Other lack of coordination: Secondary | ICD-10-CM
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
## 2021-06-20 ENCOUNTER — Ambulatory Visit: Payer: PRIVATE HEALTH INSURANCE | Admitting: Physical Therapy

## 2021-06-20 ENCOUNTER — Encounter: Payer: Self-pay | Admitting: Physical Therapy

## 2021-06-20 ENCOUNTER — Ambulatory Visit: Payer: PRIVATE HEALTH INSURANCE | Admitting: Occupational Therapy

## 2021-06-20 ENCOUNTER — Encounter: Payer: Self-pay | Admitting: Occupational Therapy

## 2021-06-20 DIAGNOSIS — R278 Other lack of coordination: Secondary | ICD-10-CM

## 2021-06-20 DIAGNOSIS — M6281 Muscle weakness (generalized): Secondary | ICD-10-CM

## 2021-06-20 DIAGNOSIS — R2681 Unsteadiness on feet: Secondary | ICD-10-CM

## 2021-06-20 DIAGNOSIS — R2689 Other abnormalities of gait and mobility: Secondary | ICD-10-CM

## 2021-06-20 DIAGNOSIS — M25511 Pain in right shoulder: Secondary | ICD-10-CM

## 2021-06-20 DIAGNOSIS — R41840 Attention and concentration deficit: Secondary | ICD-10-CM
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
## 2021-06-20 NOTE — Telephone Encounter (Signed)
This message was routed to me

## 2021-06-20 NOTE — Telephone Encounter (Signed)
Sherry Montes can you please assist? She will benefit from counseling. Thanks.
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
## 2021-06-26 ENCOUNTER — Ambulatory Visit: Payer: PRIVATE HEALTH INSURANCE

## 2021-06-26 ENCOUNTER — Other Ambulatory Visit: Payer: Self-pay

## 2021-06-26 ENCOUNTER — Encounter: Payer: Self-pay | Admitting: Occupational Therapy

## 2021-06-26 ENCOUNTER — Ambulatory Visit: Payer: PRIVATE HEALTH INSURANCE | Admitting: Occupational Therapy

## 2021-06-26 DIAGNOSIS — R2681 Unsteadiness on feet: Secondary | ICD-10-CM

## 2021-06-26 DIAGNOSIS — M6281 Muscle weakness (generalized): Secondary | ICD-10-CM

## 2021-06-26 DIAGNOSIS — M25511 Pain in right shoulder: Secondary | ICD-10-CM

## 2021-06-26 DIAGNOSIS — M545 Low back pain, unspecified: Secondary | ICD-10-CM

## 2021-06-26 DIAGNOSIS — R278 Other lack of coordination: Secondary | ICD-10-CM

## 2021-06-26 DIAGNOSIS — R41840 Attention and concentration deficit: Secondary | ICD-10-CM
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
## 2021-06-28 ENCOUNTER — Other Ambulatory Visit: Payer: Self-pay

## 2021-06-28 ENCOUNTER — Ambulatory Visit: Payer: PRIVATE HEALTH INSURANCE

## 2021-06-28 ENCOUNTER — Encounter: Payer: Self-pay | Admitting: Physical Therapy

## 2021-06-28 ENCOUNTER — Ambulatory Visit: Payer: PRIVATE HEALTH INSURANCE | Admitting: Physical Therapy

## 2021-06-28 ENCOUNTER — Encounter: Payer: Self-pay | Admitting: Occupational Therapy

## 2021-06-28 ENCOUNTER — Ambulatory Visit: Payer: PRIVATE HEALTH INSURANCE | Admitting: Occupational Therapy

## 2021-06-28 DIAGNOSIS — R41840 Attention and concentration deficit: Secondary | ICD-10-CM

## 2021-06-28 DIAGNOSIS — M6281 Muscle weakness (generalized): Secondary | ICD-10-CM

## 2021-06-28 DIAGNOSIS — R2681 Unsteadiness on feet: Secondary | ICD-10-CM

## 2021-06-28 DIAGNOSIS — M25511 Pain in right shoulder: Secondary | ICD-10-CM

## 2021-06-28 DIAGNOSIS — R41841 Cognitive communication deficit: Secondary | ICD-10-CM

## 2021-06-28 DIAGNOSIS — R278 Other lack of coordination: Secondary | ICD-10-CM

## 2021-06-28 DIAGNOSIS — R2689 Other abnormalities of gait and mobility: Secondary | ICD-10-CM
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
## 2021-06-28 NOTE — Patient Instructions (Signed)
° °  I recommend continuing to listen and/or watch TED talks or lectures on YouTube about subjects you may NOT be very excited about. Take notes - pretend you are in class and will be quizzed over the contents.  Try to find some accounting videos online and take notes on these as well.  Work on [employer] - remember cooking is a good way to do this. Make 2-3 meals for yourself per week.  Assist your aunt with making grocery lists for the family.  Plan your schedule for the next day - make sure to consider your appointments. Plan to do at least one "chore" each day and accomplish it at the time you set for it.
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
## 2021-07-03 ENCOUNTER — Encounter: Payer: Self-pay | Admitting: Physical Therapy

## 2021-07-03 ENCOUNTER — Ambulatory Visit: Payer: PRIVATE HEALTH INSURANCE | Admitting: Physical Therapy

## 2021-07-03 ENCOUNTER — Other Ambulatory Visit: Payer: Self-pay

## 2021-07-03 ENCOUNTER — Encounter: Payer: Self-pay | Admitting: Occupational Therapy

## 2021-07-03 DIAGNOSIS — M6281 Muscle weakness (generalized): Secondary | ICD-10-CM

## 2021-07-03 DIAGNOSIS — R2689 Other abnormalities of gait and mobility: Secondary | ICD-10-CM

## 2021-07-03 DIAGNOSIS — R2681 Unsteadiness on feet: Secondary | ICD-10-CM
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
## 2021-07-04 ENCOUNTER — Ambulatory Visit: Payer: Self-pay | Admitting: Physical Therapy

## 2021-07-04 ENCOUNTER — Encounter: Payer: Self-pay | Admitting: Occupational Therapy

## 2021-07-06 ENCOUNTER — Ambulatory Visit: Payer: PRIVATE HEALTH INSURANCE | Attending: Physical Medicine and Rehabilitation | Admitting: Physical Therapy

## 2021-07-06 ENCOUNTER — Other Ambulatory Visit: Payer: Self-pay

## 2021-07-06 ENCOUNTER — Encounter: Payer: Self-pay | Admitting: Physical Therapy

## 2021-07-06 DIAGNOSIS — R2681 Unsteadiness on feet: Secondary | ICD-10-CM | POA: Insufficient documentation

## 2021-07-06 DIAGNOSIS — R2689 Other abnormalities of gait and mobility: Secondary | ICD-10-CM | POA: Diagnosis present

## 2021-07-06 DIAGNOSIS — M6281 Muscle weakness (generalized): Secondary | ICD-10-CM | POA: Insufficient documentation

## 2021-07-06 NOTE — Telephone Encounter (Signed)
Spoke with pt and scheduled appt for 07/19/21. ?
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
## 2021-07-10 ENCOUNTER — Encounter: Payer: PRIVATE HEALTH INSURANCE | Admitting: Occupational Therapy

## 2021-07-10 ENCOUNTER — Ambulatory Visit: Payer: PRIVATE HEALTH INSURANCE | Admitting: Physical Therapy

## 2021-07-10 ENCOUNTER — Encounter: Payer: Self-pay | Admitting: Physical Therapy

## 2021-07-10 ENCOUNTER — Other Ambulatory Visit: Payer: Self-pay

## 2021-07-10 DIAGNOSIS — R2689 Other abnormalities of gait and mobility: Secondary | ICD-10-CM

## 2021-07-10 DIAGNOSIS — M6281 Muscle weakness (generalized): Secondary | ICD-10-CM | POA: Diagnosis not present

## 2021-07-10 DIAGNOSIS — R2681 Unsteadiness on feet: Secondary | ICD-10-CM
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
## 2021-07-11 ENCOUNTER — Encounter: Payer: Self-pay | Admitting: Physical Medicine and Rehabilitation

## 2021-07-12 ENCOUNTER — Ambulatory Visit: Payer: PRIVATE HEALTH INSURANCE | Admitting: Physical Therapy

## 2021-07-12 ENCOUNTER — Encounter: Payer: Self-pay | Admitting: Occupational Therapy

## 2021-07-16 ENCOUNTER — Ambulatory Visit: Payer: PRIVATE HEALTH INSURANCE | Admitting: Physical Therapy

## 2021-07-16 ENCOUNTER — Encounter: Payer: Self-pay | Admitting: Occupational Therapy

## 2021-07-16 ENCOUNTER — Other Ambulatory Visit: Payer: Self-pay

## 2021-07-16 ENCOUNTER — Encounter: Payer: Self-pay | Admitting: Physical Therapy

## 2021-07-16 DIAGNOSIS — R2681 Unsteadiness on feet: Secondary | ICD-10-CM

## 2021-07-16 DIAGNOSIS — M6281 Muscle weakness (generalized): Secondary | ICD-10-CM

## 2021-07-16 DIAGNOSIS — R2689 Other abnormalities of gait and mobility: Secondary | ICD-10-CM
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
## 2021-07-18 ENCOUNTER — Ambulatory Visit: Payer: PRIVATE HEALTH INSURANCE | Admitting: Physical Therapy

## 2021-07-18 ENCOUNTER — Encounter: Payer: Self-pay | Admitting: Physical Therapy

## 2021-07-18 ENCOUNTER — Encounter: Payer: Self-pay | Admitting: Occupational Therapy

## 2021-07-18 ENCOUNTER — Other Ambulatory Visit: Payer: Self-pay

## 2021-07-18 DIAGNOSIS — M6281 Muscle weakness (generalized): Secondary | ICD-10-CM

## 2021-07-18 DIAGNOSIS — R2681 Unsteadiness on feet: Secondary | ICD-10-CM

## 2021-07-18 DIAGNOSIS — R2689 Other abnormalities of gait and mobility: Secondary | ICD-10-CM
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
## 2021-07-19 ENCOUNTER — Other Ambulatory Visit: Payer: Self-pay

## 2021-07-19 ENCOUNTER — Ambulatory Visit (INDEPENDENT_AMBULATORY_CARE_PROVIDER_SITE_OTHER): Payer: PRIVATE HEALTH INSURANCE | Admitting: Clinical

## 2021-07-19 DIAGNOSIS — F4323 Adjustment disorder with mixed anxiety and depressed mood: Secondary | ICD-10-CM
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
## 2021-07-23 NOTE — Patient Instructions (Signed)
Contact Amaranta Mehl' Khaalid Lefkowitz, LCSW at [phone] to schedule another appointment.  ?

## 2021-07-24 ENCOUNTER — Encounter: Payer: Self-pay | Admitting: Physical Therapy

## 2021-07-24 ENCOUNTER — Other Ambulatory Visit: Payer: Self-pay

## 2021-07-24 ENCOUNTER — Encounter: Payer: Self-pay | Admitting: Occupational Therapy

## 2021-07-24 ENCOUNTER — Ambulatory Visit: Payer: PRIVATE HEALTH INSURANCE | Admitting: Physical Therapy

## 2021-07-24 DIAGNOSIS — R2689 Other abnormalities of gait and mobility: Secondary | ICD-10-CM

## 2021-07-24 DIAGNOSIS — M6281 Muscle weakness (generalized): Secondary | ICD-10-CM

## 2021-07-24 DIAGNOSIS — R2681 Unsteadiness on feet: Secondary | ICD-10-CM
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
## 2021-09-17 ENCOUNTER — Ambulatory Visit: Payer: PRIVATE HEALTH INSURANCE | Admitting: Family Medicine

## 2021-11-14 ENCOUNTER — Ambulatory Visit: Payer: Self-pay | Admitting: Psychology

## 2021-12-10 ENCOUNTER — Ambulatory Visit: Payer: Self-pay | Admitting: Psychology

## 2021-12-18 ENCOUNTER — Ambulatory Visit: Payer: PRIVATE HEALTH INSURANCE | Admitting: Family Medicine

## 2021-12-24 ENCOUNTER — Ambulatory Visit: Payer: PRIVATE HEALTH INSURANCE | Attending: Family Medicine | Admitting: Family Medicine

## 2021-12-24 ENCOUNTER — Encounter: Payer: Self-pay | Admitting: Family Medicine

## 2021-12-24 VITALS — BP 108/76 | HR 95 | Temp 98.0°F | Resp 12 | Ht 63.0 in | Wt 145.0 lb

## 2021-12-24 DIAGNOSIS — J018 Other acute sinusitis: Secondary | ICD-10-CM | POA: Diagnosis not present

## 2021-12-24 MED ORDER — FLUTICASONE PROPIONATE 50 MCG/ACT NA SUSP: 2.0000 | Freq: Every day | NASAL | 1 refills | Status: AC

## 2021-12-24 MED ORDER — CETIRIZINE HCL 10 MG PO TABS: 10.0000 mg | ORAL_TABLET | Freq: Every day | ORAL | 1 refills | Status: AC
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
## 2021-12-24 NOTE — Patient Instructions (Signed)
Postnasal Drip Postnasal drip is the feeling of mucus going down the back of your throat. Mucus is a slimy substance that moistens and cleans your nose and throat, as well as the air pockets in face bones near your forehead and cheeks (sinuses). Small amounts of mucus pass from your nose and sinuses down the back of your throat all the time. This is normal. When you produce too much mucus or the mucus gets too thick, you can feel it. Some common causes of postnasal drip include: Having more mucus because of: A cold or the flu. Allergies. Cold air. Certain medicines. Gastroesophageal reflux. Having more mucus that is thicker because of: A sinus or nasal infection. Dry air. A food allergy. Follow these instructions at home: Relieving discomfort  Gargle with a mixture of salt and water 3-4 times a day or as needed. To make salt water, completely dissolve -1 tsp (3-6 g) of salt in 1 cup (237 mL) of warm water. If the air in your home is dry, use a humidifier to add moisture to the air. Use a saline spray or a container (neti pot) to flush out the nose (nasal irrigation). These methods can help clear away mucus and keep the nasal passages moist. General instructions Take over-the-counter and prescription medicines only as told by your health care provider. Follow instructions from your health care provider about eating or drinking restrictions. You may need to avoid caffeine. Avoid things that you know you are allergic to (allergens), like dust, mold, pollen, pets, or certain foods. Drink enough fluid to keep your urine pale yellow. Keep all follow-up visits. This is important. Contact a health care provider if: You have a fever. You have a sore throat or difficulty swallowing. You have a headache. You have sinus or ear pain. You have a cough that does not go away. The mucus from your nose becomes thick and is green or yellow in color. You have cold or flu symptoms that last more than 10  days. Summary Postnasal drip is the feeling of mucus going down the back of your throat. Use nasal irrigation or a nasal spray to help clear away mucus and keep the nasal passages moist. Avoid things that you know you are allergic to (allergens), like dust, mold, pollen, pets, or certain foods. This information is not intended to replace advice given to you by your health care provider. Make sure you discuss any questions you have with your health care provider. Document Revised: 03/22/2021 Document Reviewed: 03/22/2021 Elsevier Patient Education  2023 Elsevier Inc.  

## 2022-01-03 ENCOUNTER — Encounter: Payer: PRIVATE HEALTH INSURANCE | Admitting: Psychology

## 2022-02-12 ENCOUNTER — Ambulatory Visit: Payer: PRIVATE HEALTH INSURANCE | Attending: Family Medicine

## 2022-02-12 DIAGNOSIS — Z23 Encounter for immunization: Secondary | ICD-10-CM

## 2022-02-12 NOTE — Progress Notes (Signed)
Pt arrived for HPV vaccine Pt given vaccine in left deltoid muscle Pt to return in 2 months for 2nd vaccine.

## 2022-04-15 ENCOUNTER — Ambulatory Visit: Payer: Self-pay | Attending: Family Medicine

## 2022-04-15 DIAGNOSIS — Z23 Encounter for immunization: Secondary | ICD-10-CM

## 2022-04-16 ENCOUNTER — Ambulatory Visit: Payer: Self-pay

## 2022-10-15 ENCOUNTER — Ambulatory Visit: Payer: Self-pay | Attending: Family Medicine

## 2022-10-15 DIAGNOSIS — Z23 Encounter for immunization: Secondary | ICD-10-CM

## 2022-10-15 NOTE — Progress Notes (Signed)
HPV vaccine administered in Left deltoid per protocols.  Information sheet given. Patient denies and pain or discomfort at injection site. Tolerated injection well no reaction.  
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
